# Patient Record
Sex: Female | Born: 1960
Health system: Southern US, Community
[De-identification: ages and names within clinical notes are randomized; demographics above are authoritative.]

## PROBLEM LIST (undated history)

## (undated) DIAGNOSIS — S92302A Fracture of unspecified metatarsal bone(s), left foot, initial encounter for closed fracture: Secondary | ICD-10-CM

## (undated) DIAGNOSIS — R569 Unspecified convulsions: Secondary | ICD-10-CM

## (undated) DIAGNOSIS — R51 Headache: Secondary | ICD-10-CM

## (undated) DIAGNOSIS — R937 Abnormal findings on diagnostic imaging of other parts of musculoskeletal system: Secondary | ICD-10-CM

## (undated) DIAGNOSIS — K219 Gastro-esophageal reflux disease without esophagitis: Secondary | ICD-10-CM

## (undated) DIAGNOSIS — R0789 Other chest pain: Secondary | ICD-10-CM

## (undated) DIAGNOSIS — R519 Headache, unspecified: Secondary | ICD-10-CM

## (undated) HISTORY — DX: Headache: R51

## (undated) HISTORY — PX: WRIST SURGERY: SHX841

## (undated) HISTORY — DX: Fracture of unspecified metatarsal bone(s), left foot, initial encounter for closed fracture: S92.302A

## (undated) HISTORY — DX: Headache, unspecified: R51.9

## (undated) HISTORY — PX: COLONOSCOPY: SHX174

## (undated) HISTORY — DX: Gastro-esophageal reflux disease without esophagitis: K21.9

## (undated) HISTORY — DX: Other chest pain: R07.89

## (undated) HISTORY — DX: Abnormal findings on diagnostic imaging of other parts of musculoskeletal system: R93.7

---

## 1991-07-29 HISTORY — PX: BIOPSY ENDOMETRIAL: PRO11

## 1998-01-07 ENCOUNTER — Encounter: Admission: RE | Admit: 1998-01-07 | Discharge: 1998-01-07 | Payer: Self-pay | Admitting: Family Medicine

## 1998-03-18 ENCOUNTER — Encounter: Admission: RE | Admit: 1998-03-18 | Discharge: 1998-03-18 | Payer: Self-pay | Admitting: Family Medicine

## 1998-04-28 ENCOUNTER — Encounter: Admission: RE | Admit: 1998-04-28 | Discharge: 1998-04-28 | Payer: Self-pay | Admitting: Family Medicine

## 1998-05-05 ENCOUNTER — Encounter: Admission: RE | Admit: 1998-05-05 | Discharge: 1998-05-05 | Payer: Self-pay | Admitting: Family Medicine

## 1998-05-06 ENCOUNTER — Encounter: Admission: RE | Admit: 1998-05-06 | Discharge: 1998-05-06 | Payer: Self-pay | Admitting: Family Medicine

## 1998-05-20 ENCOUNTER — Encounter: Admission: RE | Admit: 1998-05-20 | Discharge: 1998-05-20 | Payer: Self-pay | Admitting: Sports Medicine

## 1998-06-24 ENCOUNTER — Encounter: Admission: RE | Admit: 1998-06-24 | Discharge: 1998-06-24 | Payer: Self-pay | Admitting: Family Medicine

## 1998-07-29 ENCOUNTER — Encounter: Admission: RE | Admit: 1998-07-29 | Discharge: 1998-07-29 | Payer: Self-pay | Admitting: Family Medicine

## 1998-09-30 ENCOUNTER — Encounter: Admission: RE | Admit: 1998-09-30 | Discharge: 1998-09-30 | Payer: Self-pay | Admitting: Family Medicine

## 1998-11-18 ENCOUNTER — Encounter: Admission: RE | Admit: 1998-11-18 | Discharge: 1998-11-18 | Payer: Self-pay | Admitting: Family Medicine

## 1998-11-25 ENCOUNTER — Other Ambulatory Visit: Admission: RE | Admit: 1998-11-25 | Discharge: 1998-11-25 | Payer: Self-pay | Admitting: Family Medicine

## 1998-11-25 ENCOUNTER — Encounter: Admission: RE | Admit: 1998-11-25 | Discharge: 1998-11-25 | Payer: Self-pay | Admitting: Family Medicine

## 1999-02-24 ENCOUNTER — Encounter: Admission: RE | Admit: 1999-02-24 | Discharge: 1999-02-24 | Payer: Self-pay | Admitting: Family Medicine

## 1999-03-30 ENCOUNTER — Encounter: Admission: RE | Admit: 1999-03-30 | Discharge: 1999-03-30 | Payer: Self-pay | Admitting: Family Medicine

## 1999-04-07 ENCOUNTER — Encounter: Admission: RE | Admit: 1999-04-07 | Discharge: 1999-04-07 | Payer: Self-pay | Admitting: Sports Medicine

## 1999-06-23 ENCOUNTER — Encounter: Admission: RE | Admit: 1999-06-23 | Discharge: 1999-06-23 | Payer: Self-pay | Admitting: Family Medicine

## 1999-07-14 ENCOUNTER — Encounter: Admission: RE | Admit: 1999-07-14 | Discharge: 1999-07-14 | Payer: Self-pay | Admitting: Family Medicine

## 1999-08-04 ENCOUNTER — Encounter: Admission: RE | Admit: 1999-08-04 | Discharge: 1999-08-04 | Payer: Self-pay | Admitting: Sports Medicine

## 1999-08-11 ENCOUNTER — Encounter: Admission: RE | Admit: 1999-08-11 | Discharge: 1999-08-11 | Payer: Self-pay | Admitting: Family Medicine

## 1999-08-17 ENCOUNTER — Encounter: Admission: RE | Admit: 1999-08-17 | Discharge: 1999-08-17 | Payer: Self-pay | Admitting: Family Medicine

## 1999-10-13 ENCOUNTER — Ambulatory Visit (HOSPITAL_COMMUNITY): Admission: RE | Admit: 1999-10-13 | Discharge: 1999-10-13 | Payer: Self-pay | Admitting: Family Medicine

## 1999-10-13 ENCOUNTER — Encounter: Admission: RE | Admit: 1999-10-13 | Discharge: 1999-10-13 | Payer: Self-pay | Admitting: Family Medicine

## 1999-10-25 ENCOUNTER — Encounter: Payer: Self-pay | Admitting: *Deleted

## 1999-10-25 ENCOUNTER — Emergency Department (HOSPITAL_COMMUNITY): Admission: EM | Admit: 1999-10-25 | Discharge: 1999-10-25 | Payer: Self-pay | Admitting: *Deleted

## 1999-10-29 DIAGNOSIS — K219 Gastro-esophageal reflux disease without esophagitis: Secondary | ICD-10-CM

## 1999-10-29 HISTORY — DX: Gastro-esophageal reflux disease without esophagitis: K21.9

## 1999-11-10 ENCOUNTER — Encounter: Admission: RE | Admit: 1999-11-10 | Discharge: 1999-11-10 | Payer: Self-pay | Admitting: Family Medicine

## 1999-11-18 ENCOUNTER — Ambulatory Visit (HOSPITAL_COMMUNITY): Admission: RE | Admit: 1999-11-18 | Discharge: 1999-11-18 | Payer: Self-pay | Admitting: Gastroenterology

## 2000-04-26 ENCOUNTER — Encounter: Admission: RE | Admit: 2000-04-26 | Discharge: 2000-04-26 | Payer: Self-pay | Admitting: Family Medicine

## 2000-08-16 ENCOUNTER — Encounter: Admission: RE | Admit: 2000-08-16 | Discharge: 2000-08-16 | Payer: Self-pay | Admitting: Family Medicine

## 2001-04-07 ENCOUNTER — Encounter: Admission: RE | Admit: 2001-04-07 | Discharge: 2001-04-07 | Payer: Self-pay | Admitting: Family Medicine

## 2001-04-07 ENCOUNTER — Encounter: Admission: RE | Admit: 2001-04-07 | Discharge: 2001-04-07 | Payer: Self-pay | Admitting: Sports Medicine

## 2001-04-07 ENCOUNTER — Encounter: Payer: Self-pay | Admitting: Family Medicine

## 2001-04-18 ENCOUNTER — Encounter: Admission: RE | Admit: 2001-04-18 | Discharge: 2001-04-18 | Payer: Self-pay | Admitting: Family Medicine

## 2001-09-05 ENCOUNTER — Encounter: Admission: RE | Admit: 2001-09-05 | Discharge: 2001-09-05 | Payer: Self-pay | Admitting: Family Medicine

## 2001-09-11 ENCOUNTER — Encounter: Payer: Self-pay | Admitting: Family Medicine

## 2001-09-11 ENCOUNTER — Encounter: Admission: RE | Admit: 2001-09-11 | Discharge: 2001-09-11 | Payer: Self-pay | Admitting: Family Medicine

## 2001-09-18 ENCOUNTER — Encounter: Admission: RE | Admit: 2001-09-18 | Discharge: 2001-09-18 | Payer: Self-pay | Admitting: Family Medicine

## 2001-12-05 ENCOUNTER — Encounter: Admission: RE | Admit: 2001-12-05 | Discharge: 2001-12-05 | Payer: Self-pay | Admitting: Family Medicine

## 2002-04-10 ENCOUNTER — Encounter: Admission: RE | Admit: 2002-04-10 | Discharge: 2002-04-10 | Payer: Self-pay | Admitting: Family Medicine

## 2002-08-21 ENCOUNTER — Encounter: Admission: RE | Admit: 2002-08-21 | Discharge: 2002-08-21 | Payer: Self-pay | Admitting: Family Medicine

## 2002-09-13 ENCOUNTER — Encounter: Admission: RE | Admit: 2002-09-13 | Discharge: 2002-09-13 | Payer: Self-pay | Admitting: Family Medicine

## 2002-09-13 ENCOUNTER — Encounter: Payer: Self-pay | Admitting: Family Medicine

## 2002-12-04 ENCOUNTER — Encounter: Admission: RE | Admit: 2002-12-04 | Discharge: 2002-12-04 | Payer: Self-pay | Admitting: Family Medicine

## 2002-12-27 HISTORY — PX: PERINEAL HIDRADENITIS EXCISION: SUR524

## 2003-06-18 ENCOUNTER — Encounter: Payer: Self-pay | Admitting: Family Medicine

## 2003-06-18 ENCOUNTER — Encounter: Admission: RE | Admit: 2003-06-18 | Discharge: 2003-06-18 | Payer: Self-pay | Admitting: Family Medicine

## 2003-08-28 DIAGNOSIS — S92302A Fracture of unspecified metatarsal bone(s), left foot, initial encounter for closed fracture: Secondary | ICD-10-CM

## 2003-08-28 HISTORY — DX: Fracture of unspecified metatarsal bone(s), left foot, initial encounter for closed fracture: S92.302A

## 2003-09-16 ENCOUNTER — Encounter: Admission: RE | Admit: 2003-09-16 | Discharge: 2003-09-16 | Payer: Self-pay | Admitting: Family Medicine

## 2003-09-16 ENCOUNTER — Encounter: Admission: RE | Admit: 2003-09-16 | Discharge: 2003-09-16 | Payer: Self-pay | Admitting: Sports Medicine

## 2003-10-01 ENCOUNTER — Encounter: Admission: RE | Admit: 2003-10-01 | Discharge: 2003-10-01 | Payer: Self-pay | Admitting: Family Medicine

## 2003-10-07 ENCOUNTER — Encounter: Admission: RE | Admit: 2003-10-07 | Discharge: 2003-10-07 | Payer: Self-pay | Admitting: Family Medicine

## 2003-10-08 ENCOUNTER — Encounter: Admission: RE | Admit: 2003-10-08 | Discharge: 2003-10-08 | Payer: Self-pay | Admitting: Family Medicine

## 2003-10-29 ENCOUNTER — Ambulatory Visit (HOSPITAL_COMMUNITY): Admission: RE | Admit: 2003-10-29 | Discharge: 2003-10-29 | Payer: Self-pay | Admitting: Family Medicine

## 2003-10-29 ENCOUNTER — Encounter: Admission: RE | Admit: 2003-10-29 | Discharge: 2003-10-29 | Payer: Self-pay | Admitting: Family Medicine

## 2004-02-18 ENCOUNTER — Encounter: Admission: RE | Admit: 2004-02-18 | Discharge: 2004-02-18 | Payer: Self-pay | Admitting: Sports Medicine

## 2004-05-19 ENCOUNTER — Encounter: Admission: RE | Admit: 2004-05-19 | Discharge: 2004-05-19 | Payer: Self-pay | Admitting: Family Medicine

## 2004-06-27 HISTORY — PX: BIOPSY ENDOMETRIAL: PRO11

## 2004-06-30 ENCOUNTER — Ambulatory Visit: Payer: Self-pay | Admitting: Family Medicine

## 2004-07-07 ENCOUNTER — Encounter: Payer: Self-pay | Admitting: Family Medicine

## 2004-07-07 ENCOUNTER — Ambulatory Visit: Payer: Self-pay | Admitting: Family Medicine

## 2004-09-29 ENCOUNTER — Ambulatory Visit: Payer: Self-pay | Admitting: Family Medicine

## 2004-10-08 ENCOUNTER — Encounter: Admission: RE | Admit: 2004-10-08 | Discharge: 2004-10-08 | Payer: Self-pay | Admitting: Sports Medicine

## 2004-12-02 ENCOUNTER — Encounter: Admission: RE | Admit: 2004-12-02 | Discharge: 2004-12-02 | Payer: Self-pay | Admitting: Sports Medicine

## 2005-01-26 ENCOUNTER — Ambulatory Visit: Payer: Self-pay | Admitting: Family Medicine

## 2005-07-20 ENCOUNTER — Ambulatory Visit: Payer: Self-pay | Admitting: Family Medicine

## 2005-07-27 ENCOUNTER — Encounter: Admission: RE | Admit: 2005-07-27 | Discharge: 2005-07-27 | Payer: Self-pay | Admitting: Family Medicine

## 2005-10-19 ENCOUNTER — Ambulatory Visit: Payer: Self-pay | Admitting: Family Medicine

## 2005-12-14 ENCOUNTER — Ambulatory Visit: Payer: Self-pay | Admitting: Family Medicine

## 2005-12-23 ENCOUNTER — Emergency Department (HOSPITAL_COMMUNITY): Admission: EM | Admit: 2005-12-23 | Discharge: 2005-12-23 | Payer: Self-pay | Admitting: Emergency Medicine

## 2006-01-25 ENCOUNTER — Ambulatory Visit: Payer: Self-pay | Admitting: Family Medicine

## 2006-04-06 ENCOUNTER — Encounter: Admission: RE | Admit: 2006-04-06 | Discharge: 2006-04-06 | Payer: Self-pay | Admitting: Family Medicine

## 2006-07-12 ENCOUNTER — Ambulatory Visit: Payer: Self-pay | Admitting: Family Medicine

## 2006-07-28 ENCOUNTER — Encounter (INDEPENDENT_AMBULATORY_CARE_PROVIDER_SITE_OTHER): Payer: Self-pay | Admitting: *Deleted

## 2006-07-28 LAB — CONVERTED CEMR LAB

## 2006-08-09 ENCOUNTER — Emergency Department (HOSPITAL_COMMUNITY): Admission: EM | Admit: 2006-08-09 | Discharge: 2006-08-09 | Payer: Self-pay | Admitting: Family Medicine

## 2006-08-16 ENCOUNTER — Ambulatory Visit: Payer: Self-pay | Admitting: Family Medicine

## 2006-08-16 ENCOUNTER — Encounter: Payer: Self-pay | Admitting: Family Medicine

## 2006-11-24 DIAGNOSIS — E669 Obesity, unspecified: Secondary | ICD-10-CM

## 2006-11-24 DIAGNOSIS — N949 Unspecified condition associated with female genital organs and menstrual cycle: Secondary | ICD-10-CM | POA: Insufficient documentation

## 2006-11-24 DIAGNOSIS — K209 Esophagitis, unspecified without bleeding: Secondary | ICD-10-CM | POA: Insufficient documentation

## 2006-11-24 DIAGNOSIS — L719 Rosacea, unspecified: Secondary | ICD-10-CM

## 2006-11-24 DIAGNOSIS — F3181 Bipolar II disorder: Secondary | ICD-10-CM

## 2006-11-24 DIAGNOSIS — F429 Obsessive-compulsive disorder, unspecified: Secondary | ICD-10-CM

## 2006-11-24 DIAGNOSIS — K649 Unspecified hemorrhoids: Secondary | ICD-10-CM | POA: Insufficient documentation

## 2006-11-24 DIAGNOSIS — L732 Hidradenitis suppurativa: Secondary | ICD-10-CM

## 2006-11-24 DIAGNOSIS — M5382 Other specified dorsopathies, cervical region: Secondary | ICD-10-CM | POA: Insufficient documentation

## 2006-11-25 ENCOUNTER — Encounter (INDEPENDENT_AMBULATORY_CARE_PROVIDER_SITE_OTHER): Payer: Self-pay | Admitting: *Deleted

## 2006-12-14 ENCOUNTER — Telehealth (INDEPENDENT_AMBULATORY_CARE_PROVIDER_SITE_OTHER): Payer: Self-pay | Admitting: *Deleted

## 2006-12-28 ENCOUNTER — Encounter: Payer: Self-pay | Admitting: Family Medicine

## 2007-01-03 ENCOUNTER — Ambulatory Visit: Payer: Self-pay | Admitting: Family Medicine

## 2007-01-18 ENCOUNTER — Telehealth: Payer: Self-pay | Admitting: Family Medicine

## 2007-02-07 ENCOUNTER — Ambulatory Visit: Payer: Self-pay | Admitting: Family Medicine

## 2007-02-07 LAB — CONVERTED CEMR LAB
BUN: 10 mg/dL (ref 6–23)
Bilirubin Urine: NEGATIVE
Blood in Urine, dipstick: NEGATIVE
CO2: 25 meq/L (ref 19–32)
Calcium: 10 mg/dL (ref 8.4–10.5)
Chloride: 100 meq/L (ref 96–112)
Cholesterol: 213 mg/dL — ABNORMAL HIGH (ref 0–200)
Creatinine, Ser: 0.71 mg/dL (ref 0.40–1.20)
Glucose, Bld: 78 mg/dL (ref 70–99)
Glucose, Urine, Semiquant: NEGATIVE
HCT: 39.6 %
HDL: 84 mg/dL (ref >39)
Hemoglobin: 13.6 g/dL
Ketones, urine, test strip: NEGATIVE
LDL Cholesterol: 106 mg/dL — ABNORMAL HIGH (ref 0–99)
MCV: 85.3 fL
Nitrite: NEGATIVE
Platelets: 316 10*3/uL
Potassium: 4.4 meq/L (ref 3.5–5.3)
Protein, U semiquant: NEGATIVE
RBC: 4.64 M/uL
Sodium: 138 meq/L (ref 135–145)
Specific Gravity, Urine: 1.01
Total CHOL/HDL Ratio: 2.5
Triglycerides: 113 mg/dL (ref <150)
Urobilinogen, UA: 0.2
VLDL: 23 mg/dL (ref 0–40)
WBC: 6.1 10*3/uL
pH: 7

## 2007-02-09 ENCOUNTER — Encounter: Payer: Self-pay | Admitting: Family Medicine

## 2007-04-11 ENCOUNTER — Ambulatory Visit: Payer: Self-pay | Admitting: Family Medicine

## 2007-04-21 ENCOUNTER — Telehealth: Payer: Self-pay | Admitting: Family Medicine

## 2007-04-27 ENCOUNTER — Encounter: Payer: Self-pay | Admitting: Family Medicine

## 2007-05-02 ENCOUNTER — Ambulatory Visit: Payer: Self-pay | Admitting: Family Medicine

## 2007-05-03 LAB — CONVERTED CEMR LAB
HCT: 37.3 % (ref 36.0–46.0)
Hemoglobin: 12 g/dL (ref 12.0–15.0)
MCHC: 32.2 g/dL (ref 30.0–36.0)
MCV: 88.2 fL (ref 78.0–100.0)
Platelets: 285 10*3/uL (ref 150–400)
RBC: 4.23 M/uL (ref 3.87–5.11)
RDW: 13.9 % (ref 11.5–14.0)
WBC: 5.3 10*3/uL (ref 4.0–10.5)

## 2007-05-26 ENCOUNTER — Telehealth: Payer: Self-pay | Admitting: Family Medicine

## 2007-07-04 ENCOUNTER — Telehealth: Payer: Self-pay | Admitting: Family Medicine

## 2007-07-04 ENCOUNTER — Ambulatory Visit: Payer: Self-pay | Admitting: Family Medicine

## 2007-09-22 ENCOUNTER — Ambulatory Visit: Payer: Self-pay | Admitting: Family Medicine

## 2007-09-22 ENCOUNTER — Encounter: Payer: Self-pay | Admitting: Family Medicine

## 2007-09-22 ENCOUNTER — Telehealth: Payer: Self-pay | Admitting: *Deleted

## 2007-09-22 LAB — CONVERTED CEMR LAB
ALT: 19 units/L (ref 0–35)
AST: 20 units/L (ref 0–37)
Albumin: 4.4 g/dL (ref 3.5–5.2)
Alkaline Phosphatase: 34 units/L — ABNORMAL LOW (ref 39–117)
BUN: 10 mg/dL (ref 6–23)
CO2: 20 meq/L (ref 19–32)
Calcium: 8.1 mg/dL — ABNORMAL LOW (ref 8.4–10.5)
Chloride: 105 meq/L (ref 96–112)
Creatinine, Ser: 0.62 mg/dL (ref 0.40–1.20)
Glucose, Bld: 73 mg/dL (ref 70–99)
Potassium: 3.7 meq/L (ref 3.5–5.3)
Sodium: 139 meq/L (ref 135–145)
TSH: 5.781 microintl units/mL — ABNORMAL HIGH (ref 0.350–5.50)
Total Bilirubin: 0.2 mg/dL — ABNORMAL LOW (ref 0.3–1.2)
Total Protein: 6.8 g/dL (ref 6.0–8.3)

## 2007-10-10 ENCOUNTER — Ambulatory Visit: Payer: Self-pay | Admitting: Family Medicine

## 2007-10-11 ENCOUNTER — Encounter: Payer: Self-pay | Admitting: *Deleted

## 2007-10-18 ENCOUNTER — Telehealth: Payer: Self-pay | Admitting: *Deleted

## 2007-11-13 ENCOUNTER — Telehealth: Payer: Self-pay | Admitting: Family Medicine

## 2007-11-18 ENCOUNTER — Encounter
Admission: RE | Admit: 2007-11-18 | Discharge: 2007-11-18 | Payer: Self-pay | Admitting: Physical Medicine and Rehabilitation

## 2007-11-21 ENCOUNTER — Encounter: Payer: Self-pay | Admitting: Family Medicine

## 2007-11-24 ENCOUNTER — Encounter: Payer: Self-pay | Admitting: Family Medicine

## 2007-11-26 DIAGNOSIS — R937 Abnormal findings on diagnostic imaging of other parts of musculoskeletal system: Secondary | ICD-10-CM

## 2007-11-26 HISTORY — DX: Abnormal findings on diagnostic imaging of other parts of musculoskeletal system: R93.7

## 2007-12-10 ENCOUNTER — Encounter
Admission: RE | Admit: 2007-12-10 | Discharge: 2007-12-10 | Payer: Self-pay | Admitting: Physical Medicine and Rehabilitation

## 2007-12-15 ENCOUNTER — Telehealth: Payer: Self-pay | Admitting: Family Medicine

## 2007-12-25 ENCOUNTER — Encounter: Payer: Self-pay | Admitting: Family Medicine

## 2008-01-30 ENCOUNTER — Encounter: Payer: Self-pay | Admitting: *Deleted

## 2008-02-02 ENCOUNTER — Telehealth: Payer: Self-pay | Admitting: Family Medicine

## 2008-02-14 ENCOUNTER — Encounter: Payer: Self-pay | Admitting: Family Medicine

## 2008-02-14 ENCOUNTER — Telehealth (INDEPENDENT_AMBULATORY_CARE_PROVIDER_SITE_OTHER): Payer: Self-pay | Admitting: *Deleted

## 2008-02-15 ENCOUNTER — Encounter: Payer: Self-pay | Admitting: *Deleted

## 2008-02-26 ENCOUNTER — Telehealth: Payer: Self-pay | Admitting: *Deleted

## 2008-03-19 ENCOUNTER — Ambulatory Visit: Payer: Self-pay | Admitting: Family Medicine

## 2008-03-19 ENCOUNTER — Encounter: Payer: Self-pay | Admitting: Family Medicine

## 2008-03-20 ENCOUNTER — Telehealth: Payer: Self-pay | Admitting: Family Medicine

## 2008-03-21 ENCOUNTER — Telehealth (INDEPENDENT_AMBULATORY_CARE_PROVIDER_SITE_OTHER): Payer: Self-pay | Admitting: *Deleted

## 2008-03-21 ENCOUNTER — Encounter: Payer: Self-pay | Admitting: Family Medicine

## 2008-03-22 ENCOUNTER — Telehealth: Payer: Self-pay | Admitting: *Deleted

## 2008-04-03 ENCOUNTER — Telehealth: Payer: Self-pay | Admitting: *Deleted

## 2008-04-04 ENCOUNTER — Encounter: Payer: Self-pay | Admitting: Family Medicine

## 2008-04-04 ENCOUNTER — Ambulatory Visit: Payer: Self-pay | Admitting: Family Medicine

## 2008-04-09 ENCOUNTER — Encounter: Admission: RE | Admit: 2008-04-09 | Discharge: 2008-04-09 | Payer: Self-pay | Admitting: Family Medicine

## 2008-04-16 ENCOUNTER — Encounter: Payer: Self-pay | Admitting: *Deleted

## 2008-06-11 ENCOUNTER — Encounter: Payer: Self-pay | Admitting: *Deleted

## 2008-06-25 ENCOUNTER — Ambulatory Visit: Payer: Self-pay | Admitting: Family Medicine

## 2008-06-26 LAB — CONVERTED CEMR LAB
ALT: 13 units/L (ref 0–35)
AST: 15 units/L (ref 0–37)
Albumin: 4.3 g/dL (ref 3.5–5.2)
Alkaline Phosphatase: 38 units/L — ABNORMAL LOW (ref 39–117)
BUN: 9 mg/dL (ref 6–23)
CO2: 20 meq/L (ref 19–32)
Calcium: 8.7 mg/dL (ref 8.4–10.5)
Chloride: 100 meq/L (ref 96–112)
Cholesterol: 241 mg/dL — ABNORMAL HIGH (ref 0–200)
Creatinine, Ser: 0.63 mg/dL (ref 0.40–1.20)
Glucose, Bld: 81 mg/dL (ref 70–99)
HCT: 37.9 % (ref 36.0–46.0)
HDL: 58 mg/dL (ref >39)
Hemoglobin: 12.2 g/dL (ref 12.0–15.0)
LDL Cholesterol: 145 mg/dL — ABNORMAL HIGH (ref 0–99)
MCHC: 32.2 g/dL (ref 30.0–36.0)
MCV: 85.7 fL (ref 78.0–100.0)
Platelets: 299 10*3/uL (ref 150–400)
Potassium: 4 meq/L (ref 3.5–5.3)
RBC: 4.42 M/uL (ref 3.87–5.11)
RDW: 13.7 % (ref 11.5–15.5)
Sodium: 136 meq/L (ref 135–145)
TSH: 9.685 microintl units/mL — ABNORMAL HIGH (ref 0.350–4.50)
Total Bilirubin: 0.3 mg/dL (ref 0.3–1.2)
Total CHOL/HDL Ratio: 4.2
Total Protein: 6.9 g/dL (ref 6.0–8.3)
Triglycerides: 189 mg/dL — ABNORMAL HIGH (ref <150)
VLDL: 38 mg/dL (ref 0–40)
WBC: 6.8 10*3/uL (ref 4.0–10.5)

## 2008-07-09 ENCOUNTER — Telehealth: Payer: Self-pay | Admitting: Family Medicine

## 2008-07-15 ENCOUNTER — Encounter: Payer: Self-pay | Admitting: Family Medicine

## 2008-07-15 ENCOUNTER — Encounter: Admission: RE | Admit: 2008-07-15 | Discharge: 2008-07-15 | Payer: Self-pay | Admitting: Family Medicine

## 2008-07-29 ENCOUNTER — Telehealth: Payer: Self-pay | Admitting: *Deleted

## 2008-07-31 ENCOUNTER — Encounter: Payer: Self-pay | Admitting: Family Medicine

## 2008-09-10 ENCOUNTER — Telehealth: Payer: Self-pay | Admitting: *Deleted

## 2008-09-10 ENCOUNTER — Ambulatory Visit: Payer: Self-pay | Admitting: Family Medicine

## 2008-09-10 DIAGNOSIS — E039 Hypothyroidism, unspecified: Secondary | ICD-10-CM | POA: Insufficient documentation

## 2008-09-10 LAB — CONVERTED CEMR LAB
Bilirubin Urine: NEGATIVE
Blood in Urine, dipstick: NEGATIVE
Free T4: 0.83 ng/dL — ABNORMAL LOW (ref 0.89–1.80)
Glucose, Urine, Semiquant: NEGATIVE
Ketones, urine, test strip: NEGATIVE
Nitrite: NEGATIVE
Protein, U semiquant: NEGATIVE
Specific Gravity, Urine: <1.005
TSH: 6.581 microintl units/mL — ABNORMAL HIGH (ref 0.350–4.50)
Urobilinogen, UA: 0.2
pH: 7

## 2008-09-13 ENCOUNTER — Telehealth: Payer: Self-pay | Admitting: Family Medicine

## 2008-10-17 ENCOUNTER — Telehealth: Payer: Self-pay | Admitting: Family Medicine

## 2008-10-22 ENCOUNTER — Telehealth: Payer: Self-pay | Admitting: *Deleted

## 2008-10-22 ENCOUNTER — Ambulatory Visit: Payer: Self-pay | Admitting: Family Medicine

## 2008-10-22 DIAGNOSIS — K5900 Constipation, unspecified: Secondary | ICD-10-CM | POA: Insufficient documentation

## 2008-10-22 LAB — CONVERTED CEMR LAB
Free T4: 0.86 ng/dL — ABNORMAL LOW (ref 0.89–1.80)
TSH: 8.332 microintl units/mL — ABNORMAL HIGH (ref 0.350–4.50)
Vit D, 1,25-Dihydroxy: 13 — ABNORMAL LOW (ref 30–89)

## 2008-10-23 ENCOUNTER — Telehealth: Payer: Self-pay | Admitting: Family Medicine

## 2008-10-23 ENCOUNTER — Encounter: Payer: Self-pay | Admitting: Family Medicine

## 2008-11-04 ENCOUNTER — Telehealth: Payer: Self-pay | Admitting: Family Medicine

## 2008-11-19 ENCOUNTER — Ambulatory Visit: Payer: Self-pay | Admitting: Family Medicine

## 2008-11-19 DIAGNOSIS — L259 Unspecified contact dermatitis, unspecified cause: Secondary | ICD-10-CM | POA: Insufficient documentation

## 2008-11-20 LAB — CONVERTED CEMR LAB
Free T4: 1.27 ng/dL (ref 0.89–1.80)
TSH: 2.41 microintl units/mL (ref 0.350–4.50)

## 2009-01-07 ENCOUNTER — Ambulatory Visit: Payer: Self-pay | Admitting: Family Medicine

## 2009-01-07 LAB — CONVERTED CEMR LAB
Free T4: 0.71 ng/dL — ABNORMAL LOW (ref 0.80–1.80)
TSH: 2.55 microintl units/mL (ref 0.350–4.500)

## 2009-01-15 ENCOUNTER — Telehealth: Payer: Self-pay | Admitting: Family Medicine

## 2009-01-20 ENCOUNTER — Telehealth: Payer: Self-pay | Admitting: Family Medicine

## 2009-02-04 ENCOUNTER — Ambulatory Visit: Payer: Self-pay | Admitting: Family Medicine

## 2009-02-04 LAB — CONVERTED CEMR LAB
HCT: 35.5 % — ABNORMAL LOW (ref 36.0–46.0)
Hemoglobin: 11.5 g/dL — ABNORMAL LOW (ref 12.0–15.0)
MCHC: 32.4 g/dL (ref 30.0–36.0)
MCV: 84.9 fL (ref 78.0–100.0)
Platelets: 289 10*3/uL (ref 150–400)
RBC: 4.18 M/uL (ref 3.87–5.11)
RDW: 14 % (ref 11.5–15.5)
Total CK: 121 units/L (ref 7–177)
WBC: 6.9 10*3/uL (ref 4.0–10.5)

## 2009-02-12 ENCOUNTER — Encounter: Payer: Self-pay | Admitting: Family Medicine

## 2009-03-12 ENCOUNTER — Encounter: Payer: Self-pay | Admitting: Family Medicine

## 2009-03-13 ENCOUNTER — Telehealth: Payer: Self-pay | Admitting: Family Medicine

## 2009-04-07 ENCOUNTER — Encounter: Payer: Self-pay | Admitting: Family Medicine

## 2009-04-14 ENCOUNTER — Telehealth: Payer: Self-pay | Admitting: *Deleted

## 2009-04-15 ENCOUNTER — Ambulatory Visit (HOSPITAL_COMMUNITY): Admission: RE | Admit: 2009-04-15 | Discharge: 2009-04-15 | Payer: Self-pay | Admitting: Gastroenterology

## 2009-05-05 ENCOUNTER — Encounter: Payer: Self-pay | Admitting: Family Medicine

## 2009-05-13 ENCOUNTER — Ambulatory Visit: Payer: Self-pay | Admitting: Family Medicine

## 2009-05-13 LAB — CONVERTED CEMR LAB
AST: 15 units/L (ref 0–37)
Alkaline Phosphatase: 32 units/L — ABNORMAL LOW (ref 39–117)
BUN: 9 mg/dL (ref 6–23)
Calcium: 8.9 mg/dL (ref 8.4–10.5)
Creatinine, Ser: 0.6 mg/dL (ref 0.40–1.20)
MCV: 86.2 fL (ref 78.0–100.0)
Platelets: 266 10*3/uL (ref 150–400)
Potassium: 4.1 meq/L (ref 3.5–5.3)
RBC: 4.19 M/uL (ref 3.87–5.11)
Sodium: 136 meq/L (ref 135–145)
TSH: 10.359 microintl units/mL — ABNORMAL HIGH (ref 0.350–4.500)
Vit D, 25-Hydroxy: 17 ng/mL — ABNORMAL LOW (ref 30–89)
WBC: 5.9 10*3/uL (ref 4.0–10.5)

## 2009-05-16 ENCOUNTER — Encounter: Payer: Self-pay | Admitting: Family Medicine

## 2009-06-18 ENCOUNTER — Encounter: Payer: Self-pay | Admitting: Family Medicine

## 2009-06-19 ENCOUNTER — Encounter: Payer: Self-pay | Admitting: Family Medicine

## 2009-08-04 ENCOUNTER — Encounter: Admission: RE | Admit: 2009-08-04 | Discharge: 2009-08-04 | Payer: Self-pay | Admitting: Rheumatology

## 2009-09-05 ENCOUNTER — Encounter: Payer: Self-pay | Admitting: Family Medicine

## 2009-09-10 ENCOUNTER — Encounter: Payer: Self-pay | Admitting: Family Medicine

## 2009-09-30 ENCOUNTER — Ambulatory Visit: Payer: Self-pay | Admitting: Family Medicine

## 2009-09-30 DIAGNOSIS — K602 Anal fissure, unspecified: Secondary | ICD-10-CM

## 2009-09-30 DIAGNOSIS — M797 Fibromyalgia: Secondary | ICD-10-CM

## 2009-10-03 ENCOUNTER — Encounter: Payer: Self-pay | Admitting: Family Medicine

## 2009-10-03 LAB — CONVERTED CEMR LAB
HCT: 36.9 % (ref 36.0–46.0)
Hemoglobin: 12 g/dL (ref 12.0–15.0)
Platelets: 284 10*3/uL (ref 150–400)
Vit D, 25-Hydroxy: 35 ng/mL (ref 30–89)
WBC: 6.3 10*3/uL (ref 4.0–10.5)

## 2009-11-05 ENCOUNTER — Encounter: Admission: RE | Admit: 2009-11-05 | Discharge: 2009-11-05 | Payer: Self-pay | Admitting: Family Medicine

## 2009-11-13 ENCOUNTER — Encounter: Payer: Self-pay | Admitting: Family Medicine

## 2009-11-18 ENCOUNTER — Encounter: Payer: Self-pay | Admitting: *Deleted

## 2009-12-09 ENCOUNTER — Encounter: Payer: Self-pay | Admitting: Family Medicine

## 2009-12-11 ENCOUNTER — Telehealth: Payer: Self-pay | Admitting: Family Medicine

## 2009-12-25 ENCOUNTER — Telehealth: Payer: Self-pay | Admitting: *Deleted

## 2010-01-06 ENCOUNTER — Ambulatory Visit: Payer: Self-pay | Admitting: Family Medicine

## 2010-01-06 DIAGNOSIS — K921 Melena: Secondary | ICD-10-CM

## 2010-01-06 LAB — CONVERTED CEMR LAB
ALT: 19 units/L (ref 0–35)
AST: 14 units/L (ref 0–37)
Alkaline Phosphatase: 45 units/L (ref 39–117)
CO2: 24 meq/L (ref 19–32)
Creatinine, Ser: 0.65 mg/dL (ref 0.40–1.20)
Iron: 41 ug/dL — ABNORMAL LOW (ref 42–145)
MCV: 85.8 fL (ref 78.0–100.0)
Platelets: 241 10*3/uL (ref 150–400)
RDW: 14.3 % (ref 11.5–15.5)
Sodium: 139 meq/L (ref 135–145)
Total Bilirubin: 0.3 mg/dL (ref 0.3–1.2)
Total Protein: 6.6 g/dL (ref 6.0–8.3)
UIBC: 322 ug/dL
WBC: 5.4 10*3/uL (ref 4.0–10.5)

## 2010-01-07 ENCOUNTER — Encounter: Payer: Self-pay | Admitting: Family Medicine

## 2010-01-13 ENCOUNTER — Ambulatory Visit: Payer: Self-pay | Admitting: Family Medicine

## 2010-01-14 ENCOUNTER — Encounter: Payer: Self-pay | Admitting: Family Medicine

## 2010-01-14 ENCOUNTER — Encounter (INDEPENDENT_AMBULATORY_CARE_PROVIDER_SITE_OTHER): Payer: Self-pay | Admitting: *Deleted

## 2010-01-26 ENCOUNTER — Telehealth: Payer: Self-pay | Admitting: *Deleted

## 2010-02-24 ENCOUNTER — Ambulatory Visit: Payer: Self-pay | Admitting: Family Medicine

## 2010-02-24 DIAGNOSIS — D509 Iron deficiency anemia, unspecified: Secondary | ICD-10-CM

## 2010-02-24 LAB — CONVERTED CEMR LAB
HCT: 37.4 % (ref 36.0–46.0)
Hemoglobin: 12.1 g/dL (ref 12.0–15.0)
MCHC: 32.4 g/dL (ref 30.0–36.0)
MCV: 85.4 fL (ref 78.0–100.0)
RBC: 4.38 M/uL (ref 3.87–5.11)
RDW: 13.9 % (ref 11.5–15.5)

## 2010-02-25 ENCOUNTER — Encounter: Payer: Self-pay | Admitting: Family Medicine

## 2010-04-03 ENCOUNTER — Telehealth: Payer: Self-pay | Admitting: Family Medicine

## 2010-04-14 ENCOUNTER — Ambulatory Visit: Payer: Self-pay | Admitting: Family Medicine

## 2010-04-14 ENCOUNTER — Encounter: Admission: RE | Admit: 2010-04-14 | Discharge: 2010-04-14 | Payer: Self-pay | Admitting: Family Medicine

## 2010-04-14 DIAGNOSIS — R159 Full incontinence of feces: Secondary | ICD-10-CM | POA: Insufficient documentation

## 2010-04-15 ENCOUNTER — Encounter: Payer: Self-pay | Admitting: Family Medicine

## 2010-04-15 LAB — CONVERTED CEMR LAB
Hemoglobin: 12 g/dL (ref 12.0–15.0)
MCHC: 33.1 g/dL (ref 30.0–36.0)
MCV: 82.5 fL (ref 78.0–100.0)
RBC: 4.39 M/uL (ref 3.87–5.11)
Sed Rate: 9 mm/hr (ref 0–22)
WBC: 6 10*3/uL (ref 4.0–10.5)

## 2010-04-16 ENCOUNTER — Telehealth: Payer: Self-pay | Admitting: Family Medicine

## 2010-04-16 ENCOUNTER — Telehealth: Payer: Self-pay | Admitting: *Deleted

## 2010-04-23 ENCOUNTER — Telehealth (INDEPENDENT_AMBULATORY_CARE_PROVIDER_SITE_OTHER): Payer: Self-pay | Admitting: *Deleted

## 2010-05-21 ENCOUNTER — Encounter: Payer: Self-pay | Admitting: Family Medicine

## 2010-05-26 ENCOUNTER — Telehealth (INDEPENDENT_AMBULATORY_CARE_PROVIDER_SITE_OTHER): Payer: Self-pay | Admitting: *Deleted

## 2010-05-26 ENCOUNTER — Ambulatory Visit: Payer: Self-pay | Admitting: Family Medicine

## 2010-05-26 DIAGNOSIS — B079 Viral wart, unspecified: Secondary | ICD-10-CM | POA: Insufficient documentation

## 2010-05-28 ENCOUNTER — Encounter (INDEPENDENT_AMBULATORY_CARE_PROVIDER_SITE_OTHER): Payer: Self-pay | Admitting: *Deleted

## 2010-06-02 ENCOUNTER — Ambulatory Visit: Payer: Self-pay | Admitting: Family Medicine

## 2010-06-25 ENCOUNTER — Telehealth: Payer: Self-pay | Admitting: Family Medicine

## 2010-07-01 ENCOUNTER — Telehealth: Payer: Self-pay | Admitting: Family Medicine

## 2010-07-02 ENCOUNTER — Ambulatory Visit: Payer: Self-pay | Admitting: Family Medicine

## 2010-08-12 ENCOUNTER — Ambulatory Visit: Payer: Self-pay | Admitting: Family Medicine

## 2010-08-15 ENCOUNTER — Encounter: Payer: Self-pay | Admitting: Family Medicine

## 2010-08-19 ENCOUNTER — Telehealth: Payer: Self-pay | Admitting: *Deleted

## 2010-08-26 ENCOUNTER — Encounter: Payer: Self-pay | Admitting: Family Medicine

## 2010-09-04 ENCOUNTER — Encounter: Payer: Self-pay | Admitting: Family Medicine

## 2010-09-04 LAB — CONVERTED CEMR LAB
Cholesterol: 238 mg/dL — ABNORMAL HIGH (ref 0–200)
Ferritin: 300 ng/mL — ABNORMAL HIGH (ref 10–291)
Hemoglobin: 12.1 g/dL (ref 12.0–15.0)
LDL Cholesterol: 133 mg/dL — ABNORMAL HIGH (ref 0–99)
MCHC: 32.4 g/dL (ref 30.0–36.0)
MCV: 86.4 fL (ref 78.0–100.0)
RBC: 4.33 M/uL (ref 3.87–5.11)
Total CHOL/HDL Ratio: 3.8
Triglycerides: 216 mg/dL — ABNORMAL HIGH (ref <150)
VLDL: 43 mg/dL — ABNORMAL HIGH (ref 0–40)
WBC: 6.4 10*3/uL (ref 4.0–10.5)

## 2010-09-08 ENCOUNTER — Ambulatory Visit: Payer: Self-pay | Admitting: Family Medicine

## 2010-09-11 ENCOUNTER — Encounter: Payer: Self-pay | Admitting: Family Medicine

## 2010-09-15 ENCOUNTER — Encounter: Payer: Self-pay | Admitting: Family Medicine

## 2010-09-15 ENCOUNTER — Ambulatory Visit: Payer: Self-pay | Admitting: Family Medicine

## 2010-09-15 DIAGNOSIS — R269 Unspecified abnormalities of gait and mobility: Secondary | ICD-10-CM

## 2010-09-15 DIAGNOSIS — E559 Vitamin D deficiency, unspecified: Secondary | ICD-10-CM | POA: Insufficient documentation

## 2010-09-15 DIAGNOSIS — N951 Menopausal and female climacteric states: Secondary | ICD-10-CM

## 2010-09-15 LAB — CONVERTED CEMR LAB
Alkaline Phosphatase: 52 units/L (ref 39–117)
CO2: 25 meq/L (ref 19–32)
Creatinine, Ser: 0.63 mg/dL (ref 0.40–1.20)
FSH: 102.6 milliintl units/mL
Glucose, Bld: 82 mg/dL (ref 70–99)
Sed Rate: 7 mm/hr (ref 0–22)
Sodium: 139 meq/L (ref 135–145)
Total Bilirubin: 0.3 mg/dL (ref 0.3–1.2)
Total CK: 46 units/L (ref 7–177)
Total Protein: 6.9 g/dL (ref 6.0–8.3)
Vit D, 25-Hydroxy: 32 ng/mL (ref 30–89)

## 2010-09-16 ENCOUNTER — Encounter: Payer: Self-pay | Admitting: Family Medicine

## 2010-09-24 ENCOUNTER — Telehealth: Payer: Self-pay | Admitting: *Deleted

## 2010-10-18 ENCOUNTER — Encounter: Payer: Self-pay | Admitting: Family Medicine

## 2010-10-20 ENCOUNTER — Ambulatory Visit: Admit: 2010-10-20 | Payer: Self-pay

## 2010-10-27 NOTE — Assessment & Plan Note (Signed)
Summary: F/U/KH   Vital Signs:  Patient profile:   50 year old female Menstrual status:  irregular Height:      61.5 inches Weight:      173.7 pounds BMI:     32.41 Pulse rate:   94 / minute BP sitting:   143 / 92  (left arm) Cuff size:   regular  Vitals Entered By: Arlyss Repress CMA, (Feb 24, 2010 3:39 PM) CC: f/up pap. leg pain Is Patient Diabetic? No Pain Assessment Patient in pain? yes     Location: legs Intensity: 8 Onset of pain  Chronic   Primary Care Provider:  Zachery Dauer MD  CC:  f/up pap. leg pain.  History of Present Illness: Had and episode of sharp right lateral leg pain one week ago.  Aching bilateral legs since. Gabapentin helps, but has increased the dose to 1200 mg qid.Not sleepy.    She is off the promethazine and often doesn't take the morning Anafranil. OCD no worse.    Will go to counselor at Dr [facility] office June 15th. Didn't see any benefit from Cymbalta 30 or 60 mg.   No more rectal bleeding. Will be due for follow-up colonoscopy this year by Dr Elnoria Howard.   Recently stopped the iron. Not due to side effects.   Habits & Providers  Alcohol-Tobacco-Diet     Tobacco Status: quit > 6 months     Tobacco Counseling: to remain off tobacco products  Current Medications (verified): 1)  Promethazine Hcl 25 Mg Tabs (Promethazine Hcl) .... Take 1 Tab Q 6hours Prn 2)  Xanax 2 Mg Tabs (Alprazolam) .... Take 1 Tablet By Mouth Three Times A Day 3)  Anafranil 50 Mg  Caps (Clomipramine Hcl) .... Take Two Tabs in Am and 3 Tabs in P.m. 4)  Levothroid 125 Mcg Tabs (Levothyroxine Sodium) .... Take One Tablet Daily 5)  Triamcinolone Acetonide 0.1 % Crea (Triamcinolone Acetonide) .... Apply To Hands Two Times A Day As Needed 6)  Vitamin D3 1000 Unit Caps (Cholecalciferol) .... Take One Tablet Daily 7)  Omeprazole 20 Mg Cpdr (Omeprazole) .... Take One Tab  A Day As Needed 8)  Gabapentin 600 Mg Tabs (Gabapentin) .... Take 2  Tabs 4 Times Daily 9)  Aleve 220 Mg Tabs  (Naproxen Sodium) .... Take 1-2 Tabs Two Times A Day As Needed Pain 10)  Ferrous Sulfate 324 Mg Tbec (Ferrous Sulfate) .... Take One Tablet Daily  Allergies (verified): 1)  Paxil (Paroxetine Hcl)  Social History: Smoking Status:  quit > 6 months  Physical Exam  General:  Abdominal obesity, alert.   Lungs:  Normal respiratory effort, chest expands symmetrically. Lungs are clear to auscultation, no crackles or wheezes. Heart:  Normal rate and regular rhythm. S1 and S2 normal without gallop, murmur, click, rub or other extra sounds. Extremities:  Normal range of motion, without edema   Impression & Recommendations:  Problem # 1:  ANEMIA, IRON DEFICIENCY (ICD-280.9) Recommended taking iron for 6 months. Her updated medication list for this problem includes:    Ferrous Sulfate 324 Mg Tbec (Ferrous sulfate) .Marland Kitchen... Take one tablet daily  Orders: CBC-[facility] ([address]) [facility]- Est  Level 4 ([address]214)  Problem # 2:  BLOOD IN STOOL (ICD-578.1) Assessment: Improved  Orders: [facility]- Est  Level 4 ([address]454)  Problem # 3:  FIBROMYALGIA (ICD-729.1) Continue same dose of Gabapentin Her updated medication list for this problem includes:    Aleve 220 Mg Tabs (Naproxen sodium) .Marland Kitchen... Take 1-2 tabs two times a day  as needed pain  Complete Medication List: 1)  Promethazine Hcl 25 Mg Tabs (Promethazine hcl) .... Take 1 tab q 6hours prn 2)  Xanax 2 Mg Tabs (Alprazolam) .... Take 1 tablet by mouth three times a day 3)  Anafranil 50 Mg Caps (Clomipramine hcl) .... Take two tabs in am and 3 tabs in p.m. 4)  Levothroid 125 Mcg Tabs (Levothyroxine sodium) .... Take one tablet daily 5)  Triamcinolone Acetonide 0.1 % Crea (Triamcinolone acetonide) .... Apply to hands two times a day as needed 6)  Vitamin D3 1000 Unit Caps (Cholecalciferol) .... Take one tablet daily 7)  Omeprazole 20 Mg Cpdr (Omeprazole) .... Take one tab  a day as needed 8)  Gabapentin 600 Mg Tabs (Gabapentin) .... Take 2  tabs 4 times daily 9)  Aleve  220 Mg Tabs (Naproxen sodium) .... Take 1-2 tabs two times a day as needed pain 10)  Ferrous Sulfate 324 Mg Tbec (Ferrous sulfate) .... Take one tablet daily  Patient Instructions: 1)  Please schedule a follow-up appointment in 3 months .  2)  Continue medications same Prescriptions: GABAPENTIN 600 MG TABS (GABAPENTIN) Take 2  tabs 4 times daily  #240 x 6   Entered and Authorized by:   Zachery Dauer MD   Signed by:   Zachery Dauer MD on 02/24/2010   Method used:   Historical   RxID:   [id number]     Prevention & Chronic Care Immunizations   Influenza vaccine: Fluvax MCR  (09/30/2009)   Influenza vaccine due: 06/25/2009    Tetanus booster: 11/26/2002: Done.   Tetanus booster due: 11/25/2012    Pneumococcal vaccine: Not documented  Other Screening   Pap smear: NEGATIVE FOR INTRAEPITHELIAL LESIONS OR MALIGNANCY.  (01/13/2010)   Pap smear due: 03/19/2009    Mammogram: ASSESSMENT: Negative - BI-RADS 1^MM DIGITAL SCREENING  (11/05/2009)   Mammogram due: 11/05/2010  Reports requested:   Last colonoscopy report requested.  Smoking status: quit > 6 months  (02/24/2010)  Lipids   Total Cholesterol: 241  (06/25/2008)   LDL: 145  (06/25/2008)   LDL Direct: Not documented   HDL: 58  (06/25/2008)   Triglycerides: 189  (06/25/2008)   Nursing Instructions: Request report of last colonoscopy

## 2010-10-27 NOTE — Letter (Signed)
Summary: Generic Letter  [facility]  [address]   [place], [place] [address]   Phone: [phone]  Fax: [phone]    05/28/2010  Jenna Burke [address] [place], [place]  [address]  Dear Ms. Teasdale,  You have an apptiotment with Dr. Kellie Simmering for Sept. 21 at 9:30 AM.           Sincerely,   Starleen Blue RN

## 2010-10-27 NOTE — Assessment & Plan Note (Signed)
Summary: fibromyalgia,tcb   Vital Signs:  Patient profile:   50 year old female Menstrual status:  irregular Height:      61.5 inches Weight:      175 pounds BMI:     32.65 Temp:     98.1 degrees F oral Pulse rate:   87 / minute BP sitting:   116 / 78  (left arm) Cuff size:   large  Vitals Entered By: Tessie Fass CMA (January 06, 2010 10:00 AM) CC: fibromyalgia pain Is Patient Diabetic? No Pain Assessment Patient in pain? yes     Location: fibromyalgia pain Intensity: 10   Primary Care Provider:  Zachery Dauer MD  CC:  fibromyalgia pain.  History of Present Illness: Has seen Dr Waverly Ferrari a couple times and also a Veterinary surgeon.   Complains of low back and bilateral upper leg pain without numbness. Legs generally weak.   She was started on Cymbalta 30 mg and then 60 mg daily. Didn't help. No side effects. Suggested your regular doc rx Lyrica which they don't prescribe. Still washes her hands and feet frequently.   Slept poorly last night, possibly in anticipation of today's visit. Doesn't like to take showers.   Has nocturia x 2-3. Has been eating a lot of chocolate   Asked for iron studies and reported bright red blood in stools at end of interview. Scheduled for pap next week. Hasn't had menses for months.   Habits & Providers  Alcohol-Tobacco-Diet     Tobacco Status: quit  Current Medications (verified): 1)  Promethazine Hcl 25 Mg Tabs (Promethazine Hcl) .... Take 1 Tab Q 6hours Prn 2)  Xanax 2 Mg Tabs (Alprazolam) .... Take 1 Tablet By Mouth Three Times A Day 3)  Anafranil 50 Mg  Caps (Clomipramine Hcl) .... Take Two Tabs in Am and 3 Tabs in P.m. 4)  Levothroid 125 Mcg Tabs (Levothyroxine Sodium) .... Take One Tablet Daily 5)  Triamcinolone Acetonide 0.1 % Crea (Triamcinolone Acetonide) .... Apply To Hands Two Times A Day As Needed 6)  Vitamin D3 1000 Unit Caps (Cholecalciferol) .... Take One Tablet Daily 7)  Omeprazole 20 Mg Cpdr (Omeprazole) .... Take One Tab  A Day As  Needed  Allergies (verified): 1)  Paxil (Paroxetine Hcl)  Physical Exam  General:  Abdominal obesity Lungs:  Normal respiratory effort, chest expands symmetrically. Lungs are clear to auscultation, no crackles or wheezes. Heart:  Normal rate and regular rhythm. S1 and S2 normal without gallop, murmur, click, rub or other extra sounds. Abdomen:  soft and non-tender.  Marked obesity. Msk:  Pain SI area with forward and backward movement and limited motion, but no palpable spasm.  Extremities:  Normal range of motion, but pain with rotation of hips.  Neurologic:  Normal heel and toe walking.  DTR's 3+ knees and ankle with one beat of ankle clonus. SLR normal bilaterally. Skin:  Mild reddening of  hands    Inguinal Nodes:  No significant adenopathy Psych:  normally interactive, good eye contact, and moderately  anxious.     Impression & Recommendations:  Problem # 1:  OSTEOARTHROSIS UNSPEC WHETHER GEN/LOC LOWER LEG (ICD-715.96) Will see response to gabapentin. No significant disk problem on MRI of LS spine in 2009. ESR to rule out inflammatory component The following medications were removed from the medication list:    Aleve 220 Mg Tabs (Naproxen sodium) .Marland Kitchen... Take one tab three times a day  Orders: [facility]- Est  Level 4 ([address])  Problem # 2:  FIBROMYALGIA (  ICD-729.1) Will see response to gabapentin The following medications were removed from the medication list:    Aleve 220 Mg Tabs (Naproxen sodium) .Marland Kitchen... Take one tab three times a day  Orders: Sed Rate (ESR)-[facility] [id number]) Comp Met-[facility] [phone]) [facility]- Est  Level 4 (86578)  Problem # 3:  BLOOD IN STOOL (ICD-578.1) Will evaluate on pelvic exam next week Orders: Ferritin-[facility] ([id number]) Iron Binding Cap (TIBC)-[facility] ([address]) Iron -[facility] ([id number]) [facility]- Est  Level 4 (47425)  Problem # 4:  OBSESSIVE COMPUL. DISORDER (ICD-300.3) Same  Problem # 5:  OBESITY, NOS (ICD-278.00)  Action plan made for weight loss. Walking  and avoiding sweets.   Orders: [facility]- Est  Level 4 (95638)  Complete Medication List: 1)  Promethazine Hcl 25 Mg Tabs (Promethazine hcl) .... Take 1 tab q 6hours prn 2)  Xanax 2 Mg Tabs (Alprazolam) .... Take 1 tablet by mouth three times a day 3)  Anafranil 50 Mg Caps (Clomipramine hcl) .... Take two tabs in am and 3 tabs in p.m. 4)  Levothroid 125 Mcg Tabs (Levothyroxine sodium) .... Take one tablet daily 5)  Triamcinolone Acetonide 0.1 % Crea (Triamcinolone acetonide) .... Apply to hands two times a day as needed 6)  Vitamin D3 1000 Unit Caps (Cholecalciferol) .... Take one tablet daily 7)  Omeprazole 20 Mg Cpdr (Omeprazole) .... Take one tab  a day as needed 8)  Neurontin 300 Mg Caps (Gabapentin) .... Take one tab two times a day  Other Orders: CBC-[facility] ([address])  Patient Instructions: 1)  You will walk 10 minutes 5 days a week.  2)  Niew medicine, gabapentin. Call if side effects.  Prescriptions: NEURONTIN 300 MG CAPS (GABAPENTIN) Take one tab two times a day  #60 x 1   Entered by:   Luretha Murphy NP   Authorized by:   Zachery Dauer MD   Signed by:   Luretha Murphy NP on 01/06/2010   Method used:   [facility] to        [facility]  [address] [id number]       [address]       [place], [place]  [address]       Ph: [phone]       Fax: [phone]   RxID:   [id number]   Laboratory Results   Blood Tests   Date/Time Received: January 06, 2010 10:45 AM  Date/Time Reported: January 06, 2010 12:11 PM   SED rate: 10 mm/hr  Comments: ...............test performed by......Marland KitchenBonnie A. [place], MLS (ASCP)cm

## 2010-10-27 NOTE — Progress Notes (Signed)
Summary: meds prob  Phone Note Call from Patient Call back at Home Phone [phone]   Caller: Patient Summary of Call: thinks that the Gabapentin is making her back/side hurt and wants to know if the generic does this or does he think she should take to name brand? Initial call taken by: De Nurse,  April 03, 2010 8:46 AM  Follow-up for Phone Call        some pople told her that this med ruins kidneys. she stopped taking it last night. states it had been helping her legs. wonders if name brand would be better for her. told her if she has low back pain, should come in to be seen. she has appt with pcp on the 19th. refused to see anyone else,sooner. wants to know what the md thinks about this. told her i will call her back with his response Follow-up by: Golden Circle RN,  April 03, 2010 8:49 AM  Additional Follow-up for Phone Call Additional follow up Details #1::        This medicine causes sleepiness, but not pain. Rather,  it is given to treat pain The brand name is no better than the generic. I take the generic on occasion.  It has no bad effects on kidneys. Additional Follow-up by: Zachery Dauer MD,  April 03, 2010 9:21 AM     Appended Document: meds prob relayed what md wrote. she states she will keep taking the med. does not want to come in earlier about her pain. will see pcp next week. told her to call back if sh changed her mind

## 2010-10-27 NOTE — Letter (Signed)
Summary: Results Follow-up Letter  [facility]  [address]   [place], [place] [address]   Phone: [phone]  Fax: [phone]    02/25/2010  [address] [name], [place]  [address]  Dear Ms. Sarli,   The following are the results of your recent test(s): Patient: Jenna Burke Your blood count is back to normal, but I recommend continuing ferrous sulfate (iron) for a total of 6 months. Tests: (1) CBC NO Diff (Complete Blood Count) (10000)   WBC                       5.1 K/uL                    4.0-10.5   RBC                       4.38 MIL/uL                 3.87-5.11   Hemoglobin                12.1 g/dL                   27.2-53.6   Hematocrit                37.4 %                      36.0-46.0   MCV                       85.4 fL                     78.0-100.0 ! MCH                       27.6 pg                     26.0-34.0   MCHC                      32.4 g/dL                   64.4-03.4   RDW                       13.9 %                      11.5-15.5   Platelet Count            262 K/uL                    150-400   Document Creation Date: 02/25/2010 2:47 AM Sincerely,  Zachery Dauer MD [facility]            Appended Document: Results Follow-up Letter mailed.

## 2010-10-27 NOTE — Letter (Signed)
Summary: Results Follow-up Letter  [facility]  [address]   [place], [place] [address]   Phone: [phone]  Fax: [phone]    01/07/2010  [address] [place], [place]  [address]  Dear Ms. Ramer,   The following are the results of your recent test(s): Patient: Jenna Burke  Tests: (1) Iron and IBC (2390)   Iron                 [L]  41 ug/dL                    57-846   UIBC                      322 ug/dL   TIBC                      363 ug/dL                   962-952   %SAT                 [L]  11 %                        20-55 This test indicates that you are low on iron Tests: (2) CBC NO Diff (Complete Blood Count) (10000)   WBC                       5.4 K/uL                    4.0-10.5   RBC                       4.22 MIL/uL                 3.87-5.11   Hemoglobin           [L]  11.7 g/dL                   84.1-32.4   Hematocrit                36.2 %                      36.0-46.0   MCV                       85.8 fL                     78.0-100.0 ! MCH                       27.7 pg                     26.0-34.0   MCHC                      32.3 g/dL                   40.1-02.7   RDW                       14.3 %  11.5-15.5   Platelet Count            241 K/uL                    150-400 You are very mildly anemic Tests: (3) Comprehensive Metabolic Panel ([address])   Sodium                    139 mEq/L                   135-145   Potassium                 4.0 mEq/L                   3.5-5.3   Chloride                  102 mEq/L                   96-112   CO2                       24 mEq/L                    19-32   Glucose                   81 mg/dL                    60-45   BUN                       15 mg/dL                    4-09   Creatinine                0.65 mg/dL                  0.40-1.20   Bilirubin, Total          0.3 mg/dL                   8.1-1.9   Alkaline Phosphatase      45 U/L                       39-117   AST/SGOT                  14 U/L                      0-37   ALT/SGPT                  19 U/L                      0-35   Total Protein             6.6 g/dL                    1.4-7.8   Albumin                   4.4 g/dL                    2.9-5.6   Calcium  9.1 mg/dL                   6.3-87.5 Your liver and kidney function is normal. Tests: (4) Ferritin ([address])   Ferritin                  139 ng/mL                   10-291 This test indicates that your iron is normal Document Creation Date: 01/06/2010 9:16 PM _______________________________________________________________________ I recommend that you take one tablet of Ferrous Sulfate daily and we recheck your blood count in 3 - 6 months.  Sincerely,  Zachery Dauer MD [facility]           Appended Document: Results Follow-up Letter mailed

## 2010-10-27 NOTE — Assessment & Plan Note (Signed)
Summary: f/u back pain and Pap,df   Vital Signs:  Patient profile:   50 year old female Menstrual status:  irregular Height:      61.5 inches Weight:      173 pounds BMI:     32.27 Temp:     98.2 degrees F oral Pulse rate:   91 / minute BP sitting:   132 / 85  (left arm) Cuff size:   regular  Vitals Entered By: Tessie Fass CMA (January 13, 2010 2:53 PM) CC: F/U pap Is Patient Diabetic? No Pain Assessment Patient in pain? yes     Location: lower back, bilateral leg Intensity: 8   Primary Care Provider:  Zachery Dauer MD  CC:  F/U pap.  History of Present Illness: Hasn't seen any blood in her bowel movement since the last visit. No painful defectation. No abnormal vaginal bleeding. Does worry about colon cancer  The gabapentin has worked well for her pain in her back and legs decreasing it from a 5/10 to 2/10 if she takes 2 caps at a time. No drowsiness from the medication.   She will see her counsellor tomorrow and Dr Raquel James the following day. Asks if I thing Dr Raquel James should prescribe her Anaphranil, and I say she should so she can make changes as needed.   Habits & Providers  Alcohol-Tobacco-Diet     Tobacco Status: quit  Current Medications (verified): 1)  Promethazine Hcl 25 Mg Tabs (Promethazine Hcl) .... Take 1 Tab Q 6hours Prn 2)  Xanax 2 Mg Tabs (Alprazolam) .... Take 1 Tablet By Mouth Three Times A Day 3)  Anafranil 50 Mg  Caps (Clomipramine Hcl) .... Take Two Tabs in Am and 3 Tabs in P.m. 4)  Levothroid 125 Mcg Tabs (Levothyroxine Sodium) .... Take One Tablet Daily 5)  Triamcinolone Acetonide 0.1 % Crea (Triamcinolone Acetonide) .... Apply To Hands Two Times A Day As Needed 6)  Vitamin D3 1000 Unit Caps (Cholecalciferol) .... Take One Tablet Daily 7)  Omeprazole 20 Mg Cpdr (Omeprazole) .... Take One Tab  A Day As Needed 8)  Gabapentin 600 Mg Tabs (Gabapentin) .... Take One Tab 3 -4 Times Daily 9)  Aleve 220 Mg Tabs (Naproxen Sodium) .... Take 1-2 Tabs Two  Times A Day As Needed Pain  Allergies (verified): 1)  Paxil (Paroxetine Hcl)  Family History: Abd CA - MGM dx`d 8/00 Breast CA - MGA Colon CA - Mother died 07-31-1998 MI - F died,  MGF OCD - F Osteoarthritis - MGM  Physical Exam  General:  Abdominal obesity, alert.   Neck:  No deformities, masses, or tenderness noted. Breasts:  No mass, nodules, thickening, tenderness, bulging, retraction, inflamation, nipple discharge or skin changes noted.   Lungs:  Normal respiratory effort, chest expands symmetrically. Lungs are clear to auscultation, no crackles or wheezes. Heart:  Normal rate and regular rhythm. S1 and S2 normal without gallop, murmur, click, rub or other extra sounds. Abdomen:  soft and non-tender.  Marked obesity. Rectal:  No external abnormalities noted. Normal sphincter tone. No rectal masses or tenderness. No pain or bright red blood. Bwn Guaiac neg stool.  Genitalia:  normal introitus and no external lesions except a 1.5 cm polyp on the right labianormal introitus and no external lesions.  Small amount of white discharge without erythema. Non-parous cervix. Uterus and adnexae not well felt due to obesity and poor relaxation which only permitted one finger bimanual exam.  Extremities:  Normal range of motion, without edema Psych:  normally interactive, good eye contact, and moderately  anxious.     Impression & Recommendations:  Problem # 1:  FIBROMYALGIA (ICD-729.1) Back and leg pain improved on gabapentin. Will gradually increase dose.  Her updated medication list for this problem includes:    Aleve 220 Mg Tabs (Naproxen sodium) .Marland Kitchen... Take 1-2 tabs two times a day as needed pain  Orders: [facility]- Est  Level 4 ([address])  Problem # 2:  BLOOD IN STOOL (ICD-578.1) Hemacults x 3. She's to call if recurrence.  Orders: [facility]- Est  Level 4 ([address])  Problem # 3:  OBSESSIVE COMPUL. DISORDER (ICD-300.3) follow-up with Dr Raquel James,   Problem # 4:  DYSFUNCTIONAL UTERINE BLEEDING  (ICD-626.8) No longer a problem per her report, but equivocal iron studies.   Complete Medication List: 1)  Promethazine Hcl 25 Mg Tabs (Promethazine hcl) .... Take 1 tab q 6hours prn 2)  Xanax 2 Mg Tabs (Alprazolam) .... Take 1 tablet by mouth three times a day 3)  Anafranil 50 Mg Caps (Clomipramine hcl) .... Take two tabs in am and 3 tabs in p.m. 4)  Levothroid 125 Mcg Tabs (Levothyroxine sodium) .... Take one tablet daily 5)  Triamcinolone Acetonide 0.1 % Crea (Triamcinolone acetonide) .... Apply to hands two times a day as needed 6)  Vitamin D3 1000 Unit Caps (Cholecalciferol) .... Take one tablet daily 7)  Omeprazole 20 Mg Cpdr (Omeprazole) .... Take one tab  a day as needed 8)  Gabapentin 600 Mg Tabs (Gabapentin) .... Take one tab 3 -4 times daily 9)  Aleve 220 Mg Tabs (Naproxen sodium) .... Take 1-2 tabs two times a day as needed pain  Other Orders: Pap Smear-[facility] ([id number])  Patient Instructions: 1)  Increase Gabapentin to 300 mg to 2 tabs three times a day then replace it with 600 mg tab one 3 - 4 times daily.  2)  Send in the hemacult cards with a small amount of bowel movement from different days.  3)  Come in for recheck if rectal bleeding comes back.  4)  Please schedule a follow-up appointment in 1 month.  Prescriptions: GABAPENTIN 600 MG TABS (GABAPENTIN) Take one tab 3 -4 times daily  #120 x 11   Entered and Authorized by:   Zachery Dauer MD   Signed by:   Zachery Dauer MD on 01/13/2010   Method used:   [facility]        [facility]  [address] [id number]       [address]       [place], [place]  [address]       Ph: [phone]       Fax: [phone]   RxID:   [id number]    Vital Signs:  Patient profile:   50 year old female Menstrual status:  irregular Height:      61.5 inches Weight:      173 pounds BMI:     32.27 Temp:     98.2 degrees F oral Pulse rate:   91 / minute BP sitting:   132 / 85  (left arm) Cuff size:    regular  Vitals Entered By: Tessie Fass CMA (January 13, 2010 2:53 PM)  Appended Document: hemoccult card results  Laboratory Results  Date/Time Received: January 22, 2010   Date/Time Reported: January 22, 2010 5:21 PM   Stool - Occult Blood Hemmoccult #1: negative Date: 01/16/2010 Hemoccult #2: negative Date: 01/18/2010 Hemoccult #3: negative Date:  01/20/2010 Comments: ...........test performed by...........Marland KitchenTerese Door, CMA

## 2010-10-27 NOTE — Assessment & Plan Note (Signed)
Summary: f/up,tcb   Vital Signs:  Patient profile:   50 year old female Menstrual status:  irregular Height:      61.5 inches Weight:      168 pounds BMI:     31.34 Temp:     98 degrees F oral Pulse rate:   89 / minute BP sitting:   111 / 86  (right arm) Cuff size:   regular  Vitals Entered By: Tessie Fass CMA (September 30, 2009 2:51 PM) CC: F/U Is Patient Diabetic? No Pain Assessment Patient in pain? no        Primary Care Provider:  Zachery Dauer MD  CC:  F/U.  History of Present Illness: Dr Kellie Simmering diagnosis fibromyalgia. Taking Tramadol with no relief. Not taking the Ambien.   Psych appointment still not made, but promises to. It's the practice Maralyn Sago sees. Dr Waverly Ferrari is there.   Had blood with painful bowel movement last week. Constipated and has to strain to pass firm stool. Only has bowel movement every 2-3 days.  Rash right chest  gerd well controlled.  Habits & Providers  Alcohol-Tobacco-Diet     Tobacco Status: quit  Allergies: 1)  Paxil (Paroxetine Hcl)  Physical Exam  General:  Obese.  Lungs:  Normal respiratory effort, chest expands symmetrically. Lungs are clear to auscultation, no crackles or wheezes. Heart:  Normal rate and regular rhythm. S1 and S2 normal without gallop, murmur, click, rub or other extra sounds. Abdomen:  soft and non-tender.   Rectal:  No external abnormalities noted. Normal sphincter tone. No rectal masses or tenderness. Firm balls of stool in rectum. Healed fissues at 6 and 12 o'clock. No pain or bright red blood. Bwn Guaiac neg stool.  Skin:  Mild reddening and desquamation of hands "chapped"  3 cm reddened area about right lateral breast with small papules. Psych:  normally interactive, good eye contact, and slightly anxious.     Impression & Recommendations:  Problem # 1:  RECTAL FISSURE (ICD-565.0) Now healed. Needs to soften stools to prevent. Use glycerine suppositories after a meal daily Orders: [facility]- Est  Level  4 (99214) [facility] (46270)  Problem # 2:  FIBROMYALGIA (ICD-729.1) Per Dr Kellie Simmering. Encourage Psych visit to help with medication selection. The following medications were removed from the medication list:    Diclofenac Sodium 75 Mg Tbec (Diclofenac sodium) .Marland Kitchen... Take one tab two times a day Her updated medication list for this problem includes:    Aleve 220 Mg Tabs (Naproxen sodium) .Marland Kitchen... Take one tab three times a day  Orders: Vit D, 25 [facility] (35009-38182) [facility]- Est  Level 4 (99371)  Problem # 3:  OBSESSIVE COMPUL. DISORDER (ICD-300.3) Continue same meds till psych consult  Problem # 4:  HYPOTHYROIDISM, PRIMARY (ICD-244.9)  Her updated medication list for this problem includes:    Levothroid 100 Mcg Tabs (Levothyroxine sodium) .Marland Kitchen... Take one tablet daily  Orders: [facility] (69678-93810) [facility]- Est  Level 4 (17510)  Problem # 5:  ECZEMA, HANDS (ICD-692.9) Patch also left chest Her updated medication list for this problem includes:    Promethazine Hcl 25 Mg Tabs (Promethazine hcl) .Marland Kitchen... Take 1 tab q 6hours prn    Triamcinolone Acetonide 0.1 % Crea (Triamcinolone acetonide) .Marland Kitchen... Apply to hands two times a day as needed  Complete Medication List: 1)  Promethazine Hcl 25 Mg Tabs (Promethazine hcl) .... Take 1 tab q 6hours prn 2)  Xanax 2 Mg Tabs (Alprazolam) .... Take 1 tablet by mouth three times a day  3)  Anafranil 50 Mg Caps (Clomipramine hcl) .... Take two tabs in am and 3 tabs in p.m. 4)  Levothroid 100 Mcg Tabs (Levothyroxine sodium) .... Take one tablet daily 5)  Triamcinolone Acetonide 0.1 % Crea (Triamcinolone acetonide) .... Apply to hands two times a day as needed 6)  Vitamin D3 1000 Unit Caps (Cholecalciferol) .... Take one tablet daily 7)  Omeprazole 20 Mg Cpdr (Omeprazole) .... Take one tab  a day as needed 8)  Aleve 220 Mg Tabs (Naproxen sodium) .... Take one tab three times a day  Other Orders: Influenza Vaccine MCR (27062)  Patient Instructions: 1)  Eat fiber  One cereal every day. 2)  Use a glycerine suppository as directed.  Prescriptions: ANAFRANIL 50 MG  CAPS (CLOMIPRAMINE HCL) Take two tabs in AM and 3 tabs in P.M. Brand medically necessary #150 Capsule x 2   Entered and Authorized by:   Zachery Dauer MD   Signed by:   Zachery Dauer MD on 09/30/2009   Method used:   Print then Give to Patient   RxID:   [id number] XANAX 2 MG TABS (ALPRAZOLAM) Take 1 tablet by mouth three times a day  #90 x 2   Entered and Authorized by:   Zachery Dauer MD   Signed by:   Zachery Dauer MD on 09/30/2009   Method used:   Print then Give to Patient   RxID:   [id number]    Immunizations Administered:  Influenza Vaccine # 1:    Vaccine Type: Fluvax MCR    Site: left deltoid    Mfr: GlaxoSmithKline    Dose: 0.5 ml    Route: IM    Given by: Tessie Fass CMA    Exp. Date: 03/26/2010    Lot #: [id number]    VIS given: 04/20/07 version given September 30, 2009.  Flu Vaccine Consent Questions:    Do you have a history of severe allergic reactions to this vaccine? no    Any prior history of allergic reactions to egg and/or gelatin? no    Do you have a sensitivity to the preservative Thimersol? no    Do you have a past history of Guillan-Barre Syndrome? no    Do you currently have an acute febrile illness? no    Have you ever had a severe reaction to latex? no    Vaccine information given and explained to patient? yes    Are you currently pregnant? no

## 2010-10-27 NOTE — Progress Notes (Signed)
Summary: phnmsg  Phone Note Call from Patient Call back at Home Phone [phone]   Caller: Patient Summary of Call: wants a copy or something written out about the results of her MRI - she talked with Dr Sheffield Slider but doesn't remember what he said and wants it written. Initial call taken by: De Nurse,  April 23, 2010 10:27 AM    Mailed copy of Dr Martin Majestic letter of 04/15/2010  Starleen Blue RN  April 23, 2010 12:22 PM

## 2010-10-27 NOTE — Progress Notes (Signed)
  Phone Note Outgoing Call   Call placed by: Jimmy Footman, CMA,  May 26, 2010 4:02 PM Call placed to: Patient Summary of Call: LVM for patient to call back to inform of appt at Dr. Ines Bloomer office on sept 21st @ 9:30am. Patient has been seen by this doctor before    Unable to reach by phone, letter mailed. Starleen Blue RN  May 28, 2010 10:21 AM

## 2010-10-27 NOTE — Progress Notes (Signed)
----   Converted from flag ---- ---- 08/19/2010 1:45 PM, Zachery Dauer MD wrote: Please ask her to come in for fasting labs and to schedule a follow-up appointment 1-2 weeks after that. ------------------------------  called pt asked her to schedule lab appt and f/u with Dr Sheffield Slider after labs.

## 2010-10-27 NOTE — Consult Note (Signed)
Summary: Colonoscopy hemorrhoids  Colonoscopy   Imported By: De Nurse 05/29/2010 11:54:38  _____________________________________________________________________  External Attachment:    Type:   Image     Comment:   External Document

## 2010-10-27 NOTE — Letter (Signed)
Summary: Results Follow-up Letter  [facility]  [address]   [place], [place] [address]   Phone: [phone]  Fax: [phone]    04/15/2010  [address] [name], [place]  [address]  Dear Ms. Lien,   The following are the results of your recent test(s): Your back scan did not show any changes from the March 2009 scan. There are no pinched nerves that would effect your bowel control.  TPatient: Jenna Burke Note: All result statuses are Final unless otherwise noted.  Tests: (1) Iron and IBC (2390)   Iron                      51 ug/dL                    35-573   UIBC                      317 ug/dL   TIBC                      368 ug/dL                   220-254   %SAT                 [L]  14 %                        20-55  Tests: (2) CBC NO Diff (Complete Blood Count) (10000)   WBC                       6.0 K/uL                    4.0-10.5   RBC                       4.39 MIL/uL                 3.87-5.11   Hemoglobin                12.0 g/dL                   27.0-62.3   Hematocrit                36.2 %                      36.0-46.0   MCV                       82.5 fL                     78.0-100.0 ! MCH                       27.3 pg                     26.0-34.0   MCHC                      33.1 g/dL                   76.2-83.1   RDW  13.3 %                      11.5-15.5   Platelet Count            289 K/uL                    150-400 You are not anemic, but your iron is still low so I recommend taking one iron tablet daily.  Tests: (3) Sed Rate (ESR) (15010)   Sed Rate (ESR)            9 mm/hr                     0-22  No increased inflammation in your body. Tests: (4) TSH (23280)   TSH                  [L]  0.009 uIU/mL                0.350-4.500  I sent an prescription to your pharmacy for a lower dose of 112 mg since this test showed that you need less.   Document Creation Date: 04/15/2010 3:54  AM _______________________________________________________________________ Sincerely,  Zachery Dauer MD [facility]            Appended Document: Results Follow-up Letter mailed

## 2010-10-27 NOTE — Letter (Signed)
Summary: Results Follow-up Letter  [facility]  [address]   [place], [place] [address]   Phone: [phone]  Fax: [phone]    10/03/2009  [address] [name], [place]  [address]  Dear Ms. Iorio,   The following are the results of your recent test(s): Tests: (1) CBC NO Diff (Complete Blood Count) (10000)   WBC                       6.3 K/uL                    4.0-10.5   RBC                       4.32 MIL/uL                 3.87-5.11   Hemoglobin                12.0 g/dL                   57.8-46.9   Hematocrit                36.9 %                      36.0-46.0   MCV                       85.4 fL                     78.0-100.0   MCHC                      32.5 g/dL                   62.9-52.8   RDW                       14.0 %                      11.5-15.5   Platelet Count            284 K/uL                    150-400 No anemia Tests: (2) TSH (23280)   TSH                  [H]  5.496 uIU/mL                0.350-4.500     ***Test methodology is 3rd generation TSH*** You need a little higher dose of thyroid so I sent an prescription for 125 micrograms tabs to your pharmacy  Tests: (3) Vitamin D (25-Hydroxy) (41324)  Vitamin D (25-Hydroxy)                             35 ng/mL                    30-89     This assay accurately quantifies Vitamin D, which is the sum of the     25-Hydroxy forms of Vitamin D2 and D3.  Studies have shown that the     optimum concentration of 25-Hydroxy Vitamin D is 30 ng/mL or  higher.      Concentrations of Vitamin D between 20 and 29 ng/mL are consideredto     be insufficient and concentrations less than 20 ng/mL are considered     to be deficient for Vitamin D.  Your vitamin D level is good Document Creation Date: 10/01/2009 1:43 AM  Sincerely,  Zachery Dauer MD [facility]           Appended Document: Results Follow-up Letter mailed.

## 2010-10-27 NOTE — Progress Notes (Signed)
Summary: Test Res  Phone Note Call from Patient Call back at Home Phone [phone]   Caller: Patient Summary of Call: Checking on MRI results. Initial call taken by: Clydell Hakim,  April 16, 2010 2:42 PM  Follow-up for Phone Call        will forward to MD. Follow-up by: Theresia Lo RN,  April 16, 2010 3:22 PM  Additional Follow-up for Phone Call Additional follow up Details #1::        I discussed with her her minimal low back changes, normal ESR, and need for lower thyroid replacement.  Dr Waverly Ferrari added Cymbalta and I encouraged her to see the results of that before a neurology appointment and to let me evaluate her facial rash before derm referral.  Additional Follow-up by: Zachery Dauer MD,  April 16, 2010 4:01 PM    New/Updated Medications: CYMBALTA 30 MG CPEP (DULOXETINE HCL) Take one tab two times a day

## 2010-10-27 NOTE — Progress Notes (Signed)
Summary: refill  Phone Note Refill Request Call back at Home Phone [phone] Message from:  Patient  Refills Requested: Medication #1:  ANAFRANIL 50 MG  CAPS Take two tabs in AM and 3 tabs in P.M. [BMN] [facility] [address]  Initial call taken by: De Nurse,  June 25, 2010 1:48 PM

## 2010-10-27 NOTE — Progress Notes (Signed)
Summary: triage congestion  Phone Note Call from Patient Call back at Home Phone [phone]   Caller: Patient Summary of Call: body aches/congestion Initial call taken by: De Nurse,  July 01, 2010 12:28 PM  Follow-up for Phone Call        started last month. has taken tylenol & OTC.  we have no appts. she refuses to go to UC. appt at 8:30amm tomorrow. advised lenty of fluids & rest Follow-up by: Golden Circle RN,  July 01, 2010 12:29 PM  Additional Follow-up for Phone Call Additional follow up Details #1::        OK, best is for me to see her.  Additional Follow-up by: Zachery Dauer MD,  July 01, 2010 2:23 PM

## 2010-10-27 NOTE — Miscellaneous (Signed)
Summary: re: psych appt/ts  Clinical Lists Changes pt had appt with Dr.Pittman on 11-13-09 at 11am. they will fax the ov notes to you.Arlyss Repress CMA,  November 18, 2009 10:31 AM

## 2010-10-27 NOTE — Miscellaneous (Signed)
Summary: change of DM companies  Clinical Lists Changes LM for her to call back. (we got a faxed form to change to [employer] for DM items & a free back brace. if she wants to change will need an appt with pcp).Golden Circle RN  January 14, 2010 2:55 PM  Is this for Jenna Burke rather that [name]?

## 2010-10-27 NOTE — Progress Notes (Signed)
Summary: results  Phone Note Call from Patient Call back at Home Phone [phone]   Caller: Patient Summary of Call: pt is wanting to know results of pap smear Initial call taken by: De Nurse,  Jan 26, 2010 8:45 AM  Follow-up for Phone Call        left message that "everything is fine"  Follow-up by: Golden Circle RN,  Jan 26, 2010 9:08 AM

## 2010-10-27 NOTE — Progress Notes (Signed)
Summary: called pt/FYI/TS  Phone Note Call from Patient Call back at Home Phone [phone]   Caller: Patient Summary of Call: Would like to get a referral to dermatologist ([facility]) for places on her face. Initial call taken by: Clydell Hakim,  April 16, 2010 2:11 PM  Follow-up for Phone Call        CALLED PT. Advised to see Dr.Hale to assess 'the spots on her face' and then he can decide, if he wants to send her to the dermatologist. pt agreed. fwd. to Dr.Hale  Follow-up by: Arlyss Repress CMA,,  April 16, 2010 2:37 PM

## 2010-10-27 NOTE — Miscellaneous (Signed)
Summary: appt to change companies  Clinical Lists Changes states she has already changed companied. appt 02/10/10 to see pcp to get forms filled out. states she has enough supplies to get her thru until then.Golden Circle RN  January 14, 2010 4:03 PM   I think this may be meant for [facility]

## 2010-10-27 NOTE — Assessment & Plan Note (Signed)
Summary: chest congestion/[place]eadle/hale   Vital Signs:  Patient profile:   50 year old female Menstrual status:  irregular Height:      61.5 inches Weight:      163 pounds BMI:     30.41 Temp:     98.9 degrees F oral Pulse rate:   111 / minute BP sitting:   127 / 87  (left arm) Cuff size:   regular  Vitals Entered By: Jimmy Footman, CMA (July 02, 2010 8:41 AM) CC: cold sxs x1 week Is Patient Diabetic? No Comments non productive cough, nasal congestion, fever(100.0)   Primary Care Provider:  Zachery Dauer MD  CC:  cold sxs x1 week.  History of Present Illness: 1) URI symptoms: non productive cough, nasal congestion, myalgias x 1 week. Clear nasal discharge when able to blow nose. Mild sore throat. Has been taking Afrin since onset of symptoms without relief. No definite sick contact. Last seen with similar symptoms 1 month ago - those symptoms had resolved completely. Has been taking chloraseptic as well for relief of sore throat.   ROS: Denies fever, nausea, vomiting or diarrhea, appetite change, conjunctivitis, ear pain, rash, lethargy, weakness      Current Medications (verified): 1)  Xanax 2 Mg Tabs (Alprazolam) .... Take 1 Tablet By Mouth Three Times A Day 2)  Anafranil 50 Mg  Caps (Clomipramine Hcl) .... Take Two Tabs in Am and 3 Tabs in P.m. 3)  Levothroid 112 Mcg Tabs (Levothyroxine Sodium) .... Take One Tablet Daily 4)  Triamcinolone Acetonide 0.1 % Crea (Triamcinolone Acetonide) .... Apply To Hands Two Times A Day As Needed 5)  Vitamin D3 1000 Unit Caps (Cholecalciferol) .... Take One Tablet Daily 6)  Omeprazole 20 Mg Cpdr (Omeprazole) .... Take One Tab Twice Daily 7)  Aleve 220 Mg Tabs (Naproxen Sodium) .... Take 1-2 Tabs Two Times A Day As Needed Pain 8)  Cymbalta 60 Mg Cpep (Duloxetine Hcl) .... Take One Tablet Daily  Allergies (verified): 1)  Paxil (Paroxetine Hcl)  Physical Exam  General:  well appearing, obese, NAD  Head:  no sinus tenderness  Eyes:  No  conjunctival injection  Ears:  TMs clear bilaterally . Nose:  congestion and clear rhinorrhea  Mouth:  Oral mucosa and oropharynx without lesions or exudates. Neck:  no lymphadenopathy   Lungs:  Normal respiratory effort, chest expands symmetrically. Lungs are clear to auscultation, no crackles or wheezes. Heart:  Normal rate and regular rhythm. S1 and S2 normal without gallop, murmur, click, rub or other extra sounds. Abdomen:  soft and non-tender.  Marked obesity. Skin:  no rash    Impression & Recommendations:  Problem # 1:  URI (ICD-465.9) Assessment New  Likely viral. Antibiotics not indicated given duration of symptoms, benign exam. Advised regarding symptomatic management. Nasal congestion likely rebound with Afrin overuse. Advised against this as well. Follow up with PCP if not improving. To make nurse visit appointment for flu shot next week.   Her updated medication list for this problem includes:    Aleve 220 Mg Tabs (Naproxen sodium) .Marland Kitchen... Take 1-2 tabs two times a day as needed pain  Orders: [facility]- Est Level  3 (22025)  Complete Medication List: 1)  Xanax 2 Mg Tabs (Alprazolam) .... Take 1 tablet by mouth three times a day 2)  Anafranil 50 Mg Caps (Clomipramine hcl) .... Take two tabs in am and 3 tabs in p.m. 3)  Levothroid 112 Mcg Tabs (Levothyroxine sodium) .... Take one tablet daily 4)  Triamcinolone Acetonide 0.1 % Crea (Triamcinolone acetonide) .... Apply to hands two times a day as needed 5)  Vitamin D3 1000 Unit Caps (Cholecalciferol) .... Take one tablet daily 6)  Omeprazole 20 Mg Cpdr (Omeprazole) .... Take one tab twice daily 7)  Aleve 220 Mg Tabs (Naproxen sodium) .... Take 1-2 tabs two times a day as needed pain 8)  Cymbalta 60 Mg Cpep (Duloxetine hcl) .... Take one tablet daily

## 2010-10-27 NOTE — Progress Notes (Signed)
Summary: called pt/ts  Phone Note Call from Patient Call back at Home Phone [phone]   Caller: Patient Summary of Call: Wanted to talk to Dr. Sheffield Slider concerning her going to see Dr. Raquel James. Initial call taken by: Clydell Hakim,  December 25, 2009 9:02 AM  Follow-up for Phone Call        called pt. Dr.Hale is not in office. Patient wanted to know, if she should sched. pap smear. Last pap 02-2008. Advised to sched. appt with Dr.Hale for pap. Pt also reports, that she will continue to see the Psychiatrist Raquel James) Follow-up by: Arlyss Repress CMA,,  December 25, 2009 9:34 AM

## 2010-10-27 NOTE — Assessment & Plan Note (Signed)
Summary: congestion,df   Vital Signs:  Patient profile:   50 year old female Menstrual status:  irregular Height:      61.5 inches Weight:      165 pounds BMI:     30.78 Temp:     99.3 degrees F oral Pulse rate:   103 / minute BP sitting:   110 / 77  (right arm) Cuff size:   regular  Vitals Entered By: Tessie Fass CMA (June 02, 2010 3:56 PM) CC: cough, congestion, and body ache x 8 days   Primary Care Provider:  Zachery Dauer MD  CC:  cough, congestion, and and body ache x 8 days.  History of Present Illness: 8 days of sore throat nasal congestion, headache   Now sore throat, cough, gums hurts, ears ache. Yellow nasal discharge, but no facial pain.   Sarah similar symptoms   Allergies: 1)  Paxil (Paroxetine Hcl)  Physical Exam  General:  mildly ill appearing  Eyes:  NO injection Ears:  External ear exam shows no significant lesions or deformities.  Otoscopic examination reveals clear canals, tympanic membranes are intact bilaterally without bulging, retraction, inflammation or discharge. Hearing is grossly normal bilaterally. Nose:  mucosal erythema.   Mouth:  Oral mucosa and oropharynx without lesions or exudates.  Teeth in good repair. Neck:  No deformities, masses, or tenderness noted. Lungs:  Normal respiratory effort, chest expands symmetrically. Lungs are clear to auscultation, no crackles or wheezes. Heart:  Normal rate and regular rhythm. S1 and S2 normal without gallop, murmur, click, rub or other extra sounds. Skin:  4 mm verrucoid skin colored lesion dorsal left upper arm a few cm about the olecranon, with collerete of erythema, but no blistering. Cervical Nodes:  No lymphadenopathy noted Psych:  Mildly dysphoric affect.     Complete Medication List: 1)  Xanax 2 Mg Tabs (Alprazolam) .... Take 1 tablet by mouth three times a day 2)  Anafranil 50 Mg Caps (Clomipramine hcl) .... Take two tabs in am and 3 tabs in p.m. 3)  Levothroid 112 Mcg Tabs  (Levothyroxine sodium) .... Take one tablet daily 4)  Triamcinolone Acetonide 0.1 % Crea (Triamcinolone acetonide) .... Apply to hands two times a day as needed 5)  Vitamin D3 1000 Unit Caps (Cholecalciferol) .... Take one tablet daily 6)  Omeprazole 20 Mg Cpdr (Omeprazole) .... Take one tab twice daily 7)  Aleve 220 Mg Tabs (Naproxen sodium) .... Take 1-2 tabs two times a day as needed pain 8)  Cymbalta 60 Mg Cpep (Duloxetine hcl) .... Take one tablet daily  Patient Instructions: 1)  You have a respiratory virus. If nose or sinus clogs you can use generic Afrin for no more than 3 days.  2)  Call if you develop a fever over 102 or symptoms of sinus infection or pneumonia.

## 2010-10-27 NOTE — Miscellaneous (Signed)
Summary: re: Psych ref and meds/ts  Clinical Lists Changes pt said, that she is out of Xanax and Anafranil. She wants to know, if Dr.Abdurahman Rugg will refill her meds, or if Dr.Pittman should refill her medications.  Will fwd. to Dr.Myracle Febres for review .Arlyss Repress CMA,  December 09, 2009 12:29 PM I asked her to see if they will refill her prescriptions it would be better to have them in control of her psychiatric meds. Regnia says she didn't like the councellor they had working with her. She'll return to their office Apr 17th.  Medications: Added new medication of CYMBALTA 30 MG CPEP (DULOXETINE HCL) take one tab two times a day

## 2010-10-27 NOTE — Progress Notes (Signed)
Summary: Rx Req  Phone Note Refill Request Call back at Home Phone [phone] Message from:  Patient  Refills Requested: Medication #1:  XANAX 2 MG TABS Take 1 tablet by mouth three times a day   Dosage confirmed as above?Dosage Confirmed   Brand Name Necessary? Yes   Supply Requested: 1 month  Medication #2:  ANAFRANIL 50 MG  CAPS Take two tabs in AM and 3 tabs in P.M. [BMN]   Dosage confirmed as above?Dosage Confirmed   Brand Name Necessary? Yes   Supply Requested: 1 month [facility] [address] BRAND NAME MEDICALLY NECESSARY.   Method Requested: Fax to Local Pharmacy Initial call taken by: Clydell Hakim,  December 11, 2009 2:23 PM

## 2010-10-27 NOTE — Assessment & Plan Note (Signed)
Summary: flu shot/eo  Nurse Visit   Vital Signs:  Patient profile:   50 year old female Menstrual status:  irregular Temp:     99 degrees F  Vitals Entered By: Theresia Lo RN (August 12, 2010 3:40 PM)  Allergies: 1)  Paxil (Paroxetine Hcl)  Immunizations Administered:  Influenza Vaccine # 1:    Vaccine Type: Fluvax MCR    Site: left deltoid    Mfr: GlaxoSmithKline    Dose: 0.5 ml    Route: IM    Given by: Theresia Lo RN    Exp. Date: 03/27/2011    Lot #: [id number]    VIS given: 04/21/10 version given August 12, 2010.  Flu Vaccine Consent Questions:    Do you have a history of severe allergic reactions to this vaccine? no    Any prior history of allergic reactions to egg and/or gelatin? no    Do you have a sensitivity to the preservative Thimersol? no    Do you have a past history of Guillan-Barre Syndrome? no    Do you currently have an acute febrile illness? no    Have you ever had a severe reaction to latex? no    Vaccine information given and explained to patient? yes    Are you currently pregnant? no  Orders Added: 1)  Influenza Vaccine MCR [00025] 2)  Administration Flu vaccine - MCR [G0008]

## 2010-10-27 NOTE — Assessment & Plan Note (Signed)
Summary: leg pain & meds prob,df   Vital Signs:  Patient profile:   50 year old female Menstrual status:  irregular Height:      61.5 inches Weight:      174 pounds BMI:     32.46 Temp:     98.0 degrees F oral Pulse rate:   86 / minute BP sitting:   104 / 68  (left arm) Cuff size:   regular  Vitals Entered By: Tessie Fass CMA (April 14, 2010 3:19 PM) CC: F/U meds and bilateral leg pain Is Patient Diabetic? No Pain Assessment Patient in pain? yes     Location: bilateral leg Intensity: 6   Primary Care Provider:  Zachery Dauer MD  CC:  F/U meds and bilateral leg pain.  History of Present Illness: Tired. Can mow the lawn with the riding mower, but not very active in house cleaning Aching in her back and legs. Rarely drinks beer, but feels better when she does.   Will visit the psychiatrist tomorrow.   Gabapentin helps her leg pain, but causes fecal incontinence. Says it improves when she doesnt' take it.   Hasn't been taking iron for months. Amenorrheic for months.   Requests referral to a dermatologist for some spots on her face.   Didn't bring her medicines for reconcilation.  Habits & Providers  Alcohol-Tobacco-Diet     Tobacco Status: quit  Allergies: 1)  Paxil (Paroxetine Hcl)  Social History: Smoking Status:  quit  Physical Exam  General:  Marked abdominal obesity, alert.   Lungs:  Normal respiratory effort, chest expands symmetrically. Lungs are clear to auscultation, no crackles or wheezes. Heart:  Normal rate and regular rhythm. S1 and S2 normal without gallop, murmur, click, rub or other extra sounds. Rectal:  No external abnormalities noted. Normal sphincter tone. No rectal masses or tenderness. Msk:  normal ROM of back.  SLR negative Pulses:  R and L orsalis pedis and posterior tibial pulses are full and equal bilaterally Neurologic:  Perianal senstion normal. Normal anal wink Skin:  two small excoriated papules on right upper lip.  Psych:   dysphoric affect.     Impression & Recommendations:  Problem # 1:  ENCOPRESIS (ICD-307.7) Concern for cauda equina syndrome, but normal sphinter toe. MRI to ensure no neurologic deficit Orders: [facility]- Est  Level 4 (99214) [facility]- Est  Level 4 (99214)  Problem # 2:  BACK PAIN (ICD-724.5) Minimal on exam Her updated medication list for this problem includes:    Aleve 220 Mg Tabs (Naproxen sodium) .Marland Kitchen... Take 1-2 tabs two times a day as needed pain  Orders: Sed Rate (ESR)-[facility] [phone]) CBC-[facility] (91478) MRI without Contrast (MRI w/o Contrast) [facility]- Est  Level 4 (29562)  Problem # 3:  BLOOD IN STOOL (ICD-578.1) Hemacult neg today  Problem # 4:  HYPOTHYROIDISM, PRIMARY (ICD-244.9) Recheck TSH next blood draw Her updated medication list for this problem includes:    Levothroid 125 Mcg Tabs (Levothyroxine sodium) .Marland Kitchen... Take one tablet daily  Orders: [facility]- Est  Level 4 (99214) [facility]- Est  Level 4 (99214)  Problem # 5:  OBSESSIVE COMPUL. DISORDER (ICD-300.3) Psychiatric follow-up.  Orders: [facility]- Est  Level 4 (13086)  Problem # 6:  ROSACEA (ICD-695.3) May be the cause of her perioral rash  Complete Medication List: 1)  Promethazine Hcl 25 Mg Tabs (Promethazine hcl) .... Take 1 tab q 6hours prn 2)  Xanax 2 Mg Tabs (Alprazolam) .... Take 1 tablet by mouth three times a day 3)  Anafranil 50 Mg Caps (Clomipramine hcl) .... Take two tabs in am and 3 tabs in p.m. 4)  Levothroid 125 Mcg Tabs (Levothyroxine sodium) .... Take one tablet daily 5)  Triamcinolone Acetonide 0.1 % Crea (Triamcinolone acetonide) .... Apply to hands two times a day as needed 6)  Vitamin D3 1000 Unit Caps (Cholecalciferol) .... Take one tablet daily 7)  Omeprazole 20 Mg Cpdr (Omeprazole) .... Take one tab  a day as needed 8)  Gabapentin 600 Mg Tabs (Gabapentin) .... Take 2  tabs 4 times daily 9)  Aleve 220 Mg Tabs (Naproxen sodium) .... Take 1-2 tabs two times a day as needed pain  Other Orders: TSH-[facility]  ([id number]) Iron -[facility] [phone]) Iron Binding Cap (TIBC)-[facility] ([address])  Patient Instructions: 1)  Please schedule a follow-up appointment in 1 month.

## 2010-10-27 NOTE — Miscellaneous (Signed)
Summary: prob list rev   

## 2010-10-27 NOTE — Assessment & Plan Note (Signed)
Summary: places on face,tcb   Vital Signs:  Patient profile:   50 year old female Menstrual status:  irregular Height:      61.5 inches Weight:      168 pounds BMI:     31.34 Temp:     98.0 degrees F oral Pulse rate:   98 / minute BP sitting:   115 / 74  (right arm) Cuff size:   regular  Vitals Entered By: Jimmy Footman, CMA (May 26, 2010 2:30 PM) CC: would like referral to rheumatologist & dematologist Is Patient Diabetic? No   Primary Care Provider:  Zachery Dauer MD  CC:  would like referral to rheumatologist & dematologist.  History of Present Illness: Skin lesion left distal upper arm is irritated and wishes it removed. Wants a referral to a dermatologist.  Wants a referral back to Dr Kellie Simmering. The Gabapentin helps her pain, but changes the consistency of her stool so that it is difficult to clean herself. Her recent colonoscopy by Dr Elnoria Howard was normal except for hemorrhoides. Maralyn Sago has shared some of ther Tresa Garter which helped her pain some. Pain currently 7/10 shoulders and low back.   Dr Raquel James has increased her Cymbalta to 60 mg daily but it hasn't helped her pain.   Habits & Providers  Alcohol-Tobacco-Diet     Tobacco Status: quit  Allergies: 1)  Paxil (Paroxetine Hcl)  Physical Exam  Skin:  4 mm verrucoid skin colored lesion dorsal left upper arm a few cm about the olecranon Psych:  Mildly dysphoric affect.     Impression & Recommendations:  Problem # 1:  VERRUCA VULGARIS (ICD-078.10) return if needs retreated Orders: [facility]- Est Level  3 (16109)  Problem # 2:  BACK PAIN (ICD-724.5)  Her updated medication list for this problem includes:    Aleve 220 Mg Tabs (Naproxen sodium) .Marland Kitchen... Take 1-2 tabs two times a day as needed pain  Orders: Rheumatology Referral (Rheumatology) [facility]- Est Level  3 (60454)  Problem # 3:  BLOOD IN STOOL (ICD-578.1) Normal colonoscopy per patient  Problem # 4:  OBSESSIVE COMPUL. DISORDER (ICD-300.3) Will see her psychiatrist again  soon.  Complete Medication List: 1)  Xanax 2 Mg Tabs (Alprazolam) .... Take 1 tablet by mouth three times a day 2)  Anafranil 50 Mg Caps (Clomipramine hcl) .... Take two tabs in am and 3 tabs in p.m. 3)  Levothroid 112 Mcg Tabs (Levothyroxine sodium) .... Take one tablet daily 4)  Triamcinolone Acetonide 0.1 % Crea (Triamcinolone acetonide) .... Apply to hands two times a day as needed 5)  Vitamin D3 1000 Unit Caps (Cholecalciferol) .... Take one tablet daily 6)  Omeprazole 20 Mg Cpdr (Omeprazole) .... Take one tab twice daily 7)  Aleve 220 Mg Tabs (Naproxen sodium) .... Take 1-2 tabs two times a day as needed pain 8)  Cymbalta 60 Mg Cpep (Duloxetine hcl) .... Take one tablet daily  Patient Instructions: 1)  Apply bandaid and antibiotic ointment to "wart" left upper arm after if develops a blister. Return for retreatment if it is still there after the blister peals off.  2)  Please schedule a follow-up appointment in 1 month.   Procedure Note Last Tetanus: Done. (11/26/2002)  Wart Removal: Onset of lesion: > 3 months Indication: inflamed lesion Consent signed: no  Procedure # 1: cryotherapy    Region: dorsal    Location: arm-upper-left    # lesions removed: 1    Technique: cryotherapy    Comment: freeze thaw x 3

## 2010-10-27 NOTE — Miscellaneous (Signed)
Summary: Declined to sign lumbar orthosis fax  Clinical Lists Changes Form faxed back to [employer]. "No indication for this orthosis"

## 2010-10-27 NOTE — Consult Note (Signed)
Summary: [facility]  [facility]   Imported By: Clydell Hakim 11/19/2009 14:29:32  _____________________________________________________________________  External Attachment:    Type:   Image     Comment:   External Document

## 2010-10-29 NOTE — Assessment & Plan Note (Signed)
Summary: f/u & mobility exam,df   Vital Signs:  Patient profile:   50 year old female Menstrual status:  irregular Height:      61.5 inches Weight:      162 pounds BMI:     30.22 Temp:     98.2 degrees F oral Pulse rate:   93 / minute BP sitting:   131 / 89  (left arm) Cuff size:   regular  Vitals Entered By: Tessie Fass CMA (September 15, 2010 3:44 PM) CC: F/U Is Patient Diabetic? No Pain Assessment Patient in pain? yes     Location: bilateral leg Intensity: 10   Primary Care Provider:  Zachery Dauer MD  CC:  F/U.  History of Present Illness: Feels weak, anxious, aching all over. OK last evening, worse today. Flashes of feeling hot and perspiring. Irregular spotting of dark menstrual blood. Friend recommended hydromorphone for pain. Dr Kellie Simmering had prescribed Prednisone, but she stopped it after a few days due to anxiety.   A complany has been sending me requests for back brace, mattress overlay, and scooter for her, which I stated are not indicated. She says they made these suggestions to her.   She stopped her thyroid medication a couple months ago thinking it was making her feel bad. Also stopped the Cymbalta suddenly a couple months ago due to palpitations. Feels cold when not having a flash. Skin dry. Face puffy.   Hasn't fallen. Chest hurts at times.   Habits & Providers  Alcohol-Tobacco-Diet     Tobacco Status: quit  Current Medications (verified): 1)  Xanax 2 Mg Tabs (Alprazolam) .... Take 1 Tablet By Mouth Three Times A Day 2)  Anafranil 50 Mg  Caps (Clomipramine Hcl) .... Take Two Tabs in Am and 3 Tabs in P.m. 3)  Levothroid 112 Mcg Tabs (Levothyroxine Sodium) .... Take One Tablet Daily 4)  Triamcinolone Acetonide 0.1 % Crea (Triamcinolone Acetonide) .... Apply To Hands Two Times A Day As Needed 5)  Vitamin D3 1000 Unit Caps (Cholecalciferol) .... Take One Tablet Daily 6)  Omeprazole 20 Mg Cpdr (Omeprazole) .... Take One Tab Twice Daily 7)  Aleve 220 Mg Tabs  (Naproxen Sodium) .... Take 1-2 Tabs Two Times A Day As Needed Pain  Allergies (verified): 1)  Paxil (Paroxetine Hcl)  Physical Exam  General:  well appearing, obese, NAD  Neck:  No thyroid enlargement Lungs:  Normal respiratory effort, chest expands symmetrically. Lungs are clear to auscultation, no crackles or wheezes. Heart:  Normal rate and regular rhythm. S1 and S2 normal without gallop, murmur, click, rub or other extra sounds. Abdomen:  soft and non-tender.  Marked obesity. Msk:  normal ROM of back.  SLR negative Extremities:  Normal range of motion, without edema Neurologic:  gait normal except slow and DTRs symmetrical and normal.   Psych:  More dysphoric affect.     Impression & Recommendations:  Problem # 1:  ABNORMALITY OF GAIT (ICD-781.2) mainly disuse weakness. I encouraged walking. Will check ESR and CK Orders: B12-[facility] ([id number])  Problem # 2:  SYMPTOMATIC MENOPAUSAL/FEMALE CLIMACTERIC STATES (ICD-627.2)  Orders: [facility]- Est  Level 4 (84166) FSH-[facility] ([id number])  Problem # 3:  HYPOTHYROIDISM, PRIMARY (ICD-244.9) May have myopathy from this. Emphasized the improtance of taking this medication. Start with half dose for one week. I'll call her next week with lab results and to see how she is tolerating the levothroid.  Her updated medication list for this problem includes:    Levothroid 112 Mcg Tabs (Levothyroxine sodium) .Marland KitchenMarland KitchenMarland KitchenMarland Kitchen  Take one tablet daily  Orders: [facility]- Est  Level 4 (10272)  Problem # 4:  FIBROMYALGIA (ICD-729.1) Emphasized the need for exercise Her updated medication list for this problem includes:    Aleve 220 Mg Tabs (Naproxen sodium) .Marland Kitchen... Take 1-2 tabs two times a day as needed pain  Orders: CK (Creatine Kinase)-[facility] [phone]) Sed Rate (ESR)-[facility] (85651) [facility]- Est  Level 4 (99214) Comp Met-[facility] ([id number])  Problem # 5:  OBESITY, NOS (ICD-278.00) Encouraged weight loss.  Orders: [facility]- Est  Level 4 (64332)  Complete Medication  List: 1)  Xanax 2 Mg Tabs (Alprazolam) .... Take 1 tablet by mouth three times a day 2)  Anafranil 50 Mg Caps (Clomipramine hcl) .... Take two tabs in am and 3 tabs in p.m. 3)  Levothroid 112 Mcg Tabs (Levothyroxine sodium) .... Take one tablet daily 4)  Triamcinolone Acetonide 0.1 % Crea (Triamcinolone acetonide) .... Apply to hands two times a day as needed 5)  Vitamin D3 1000 Unit Caps (Cholecalciferol) .... Take one tablet daily 6)  Omeprazole 20 Mg Cpdr (Omeprazole) .... Take one tab twice daily 7)  Aleve 220 Mg Tabs (Naproxen sodium) .... Take 1-2 tabs two times a day as needed pain  Other Orders: Vit D, 25 OH-[facility] ([id number])  Patient Instructions: 1)  Take one half of the thyroid pill daily for one week then one daily.  2)  I will call you next week with your lab results.  3)  Please schedule a follow-up appointment in 1 month.    Orders Added: 1)  CK (Creatine Kinase)-[facility] [[id number]] 2)  Sed Rate (ESR)-[facility] [85651] 3)  [facility]- Est  Level 4 [99214] 4)  FSH-[facility] [[id number]] 5)  Comp Met-[facility] [80053-22900] 6)  B12-[facility] [[id number]] 7)  Vit D, 25 OH-[facility] [[id number]]

## 2010-10-29 NOTE — Progress Notes (Signed)
Summary: NO POWER CHAIR NEEDED/TS  Phone Note From Other Clinic Call back at [phone]   Caller: [facility] Summary of Call: checking status of paperwork for power chair Initial call taken by: Knox Royalty,  September 24, 2010 3:53 PM  Follow-up for Phone Call        SEE PREVIOUS NOTE FROM DR.HALE. NO POWER CHAIR NEEDED.PT AGREED. Follow-up by: Arlyss Repress CMA,,  September 24, 2010 5:10 PM

## 2010-10-29 NOTE — Letter (Signed)
Summary: Results Follow-up Letter  [facility]  [address]   [place], [place] [address]   Phone: [phone]  Fax: [phone]    09/16/2010  [address] [name], [place]  [address]  Dear [name],   The following are the results of your recent test(s): Patient: Jenna Burke Good test results. I think you'll feel better awhile after getting your thyroid dose up to normal.  Tests: (1) CMP with Estimated GFR (2402)   Sodium                    139 mEq/L                   135-145   Potassium                 4.2 mEq/L                   3.5-5.3   Chloride                  103 mEq/L                   96-112   CO2                       25 mEq/L                    19-32   Glucose                   82 mg/dL                    75-64   BUN                       10 mg/dL                    3-32   Creatinine                0.63 mg/dL                  0.40-1.20   Bilirubin, Total          0.3 mg/dL                   9.5-1.8   Alkaline Phosphatase      52 U/L                      39-117   AST/SGOT                  17 U/L                      0-37   ALT/SGPT                  18 U/L                      0-35   Total Protein             6.9 g/dL                    8.4-1.6   Albumin                   4.4 g/dL  3.5-5.2   Calcium                   9.1 mg/dL                   9.5-28.4 ! Est GFR, African American  Your kidney and liver function are good. Tests: (2) Sed Rate (ESR) (15010)   Sed Rate (ESR)            7 mm/hr                     0-22 No indication of inflamatory disease. Tests: (3) CK, Total (13244)   CK, Total                 46 U/L                      7-177 No muscle inflamation to cause your aches.   Tests: (4) Vitamin B12 (01027)   Vitamin B12               579 pg/mL                   211-911  Tests: (5) FSH (25366)   FSH                       102.6 mIU/mL     Reference Ranges:              Female:                          1.4 -  18.1 mIU/mL              Female:   Follicular Phase    2.5 -  10.2 mIU/mL                        MidCycle Peak       3.4 -  33.4 mIU/mL                        Luteal Phase        1.5 -   9.1 mIU/mL                        Post Menopausal    23.0 - 116.3 mIU/mL   You are in Menopause, we'll discuss treatment when I call next week.                        Tests: (6) Vitamin D (25-Hydroxy) (44034)  Vitamin D (25-Hydroxy)                             32 ng/mL                    30-89 Continue your current vitamin D dose.  Sincerely,  Zachery Dauer MD [facility]           Appended Document: Results Follow-up Letter mailed

## 2010-10-29 NOTE — Miscellaneous (Signed)
Summary: Request for Motorized Wheelchair  Clinical Lists Changes Ms. Manard is coming for an appt on 12/20.  Spoke with rep with the vending co. Ms. Rog wanted a motorized chair from and told her to fax back the forms she had originally sent.  Explained the procedure and why denied.  I will place them in your box to be seen when Ms. Voorheis arrive for her appt. Abundio Miu  September 11, 2010 10:14 AM  On exam today, no justification found for any of the equipment they requested. They had called and requested to the patient that she needed them

## 2010-10-29 NOTE — Miscellaneous (Signed)
Summary: Declined again to sign orthosis fax  Form returned to [employer]

## 2010-11-04 ENCOUNTER — Other Ambulatory Visit: Payer: Self-pay | Admitting: Family Medicine

## 2010-11-04 DIAGNOSIS — E039 Hypothyroidism, unspecified: Secondary | ICD-10-CM

## 2010-11-05 NOTE — Telephone Encounter (Signed)
Please review and refill

## 2010-11-10 ENCOUNTER — Ambulatory Visit: Payer: Self-pay | Admitting: Family Medicine

## 2010-11-19 ENCOUNTER — Other Ambulatory Visit: Payer: Self-pay | Admitting: Family Medicine

## 2010-11-20 NOTE — Telephone Encounter (Signed)
Please review and refill

## 2010-12-17 ENCOUNTER — Other Ambulatory Visit: Payer: Self-pay | Admitting: Family Medicine

## 2010-12-17 NOTE — Telephone Encounter (Signed)
Refill request

## 2010-12-18 NOTE — Telephone Encounter (Signed)
Refill request

## 2011-01-12 ENCOUNTER — Ambulatory Visit
Admission: RE | Admit: 2011-01-12 | Discharge: 2011-01-12 | Disposition: A | Discharge status: Home / Self Care | Payer: Medicaid Other | Source: Ambulatory Visit | Attending: Family Medicine | Admitting: Family Medicine

## 2011-01-12 ENCOUNTER — Encounter: Payer: Self-pay | Admitting: Family Medicine

## 2011-01-12 ENCOUNTER — Other Ambulatory Visit: Payer: Self-pay | Admitting: Family Medicine

## 2011-01-12 DIAGNOSIS — Z1231 Encounter for screening mammogram for malignant neoplasm of breast: Secondary | ICD-10-CM

## 2011-01-13 ENCOUNTER — Encounter: Payer: Self-pay | Admitting: Family Medicine

## 2011-01-13 ENCOUNTER — Ambulatory Visit: Payer: Self-pay

## 2011-01-13 ENCOUNTER — Other Ambulatory Visit: Payer: Self-pay | Admitting: Family Medicine

## 2011-01-14 ENCOUNTER — Ambulatory Visit: Payer: Self-pay

## 2011-01-14 ENCOUNTER — Encounter: Payer: Self-pay | Admitting: Family Medicine

## 2011-01-14 ENCOUNTER — Ambulatory Visit (INDEPENDENT_AMBULATORY_CARE_PROVIDER_SITE_OTHER): Payer: Medicare Other | Admitting: Family Medicine

## 2011-01-14 VITALS — BP 143/88 | HR 96 | Temp 98.1°F | Ht 61.5 in | Wt 168.0 lb

## 2011-01-14 DIAGNOSIS — N951 Menopausal and female climacteric states: Secondary | ICD-10-CM

## 2011-01-14 DIAGNOSIS — K130 Diseases of lips: Secondary | ICD-10-CM | POA: Insufficient documentation

## 2011-01-14 DIAGNOSIS — F429 Obsessive-compulsive disorder, unspecified: Secondary | ICD-10-CM

## 2011-01-14 DIAGNOSIS — L710 Perioral dermatitis: Secondary | ICD-10-CM

## 2011-01-14 DIAGNOSIS — L259 Unspecified contact dermatitis, unspecified cause: Secondary | ICD-10-CM

## 2011-01-14 DIAGNOSIS — L719 Rosacea, unspecified: Secondary | ICD-10-CM

## 2011-01-14 DIAGNOSIS — N949 Unspecified condition associated with female genital organs and menstrual cycle: Secondary | ICD-10-CM

## 2011-01-14 DIAGNOSIS — E039 Hypothyroidism, unspecified: Secondary | ICD-10-CM

## 2011-01-14 DIAGNOSIS — IMO0001 Reserved for inherently not codable concepts without codable children: Secondary | ICD-10-CM

## 2011-01-14 MED ORDER — LEVOTHYROXINE SODIUM 112 MCG PO TABS: 112.0000 ug | ORAL_TABLET | Freq: Every day | ORAL | Status: DC

## 2011-01-14 MED ORDER — METRONIDAZOLE 0.75 % EX GEL: Freq: Two times a day (BID) | CUTANEOUS | Status: AC

## 2011-01-14 MED ORDER — KETOCONAZOLE-HYDROCORTISONE 2 & 1 % (CREAM) EX KIT: 1.0000 "application " | PACK | Freq: Three times a day (TID) | CUTANEOUS | Status: DC

## 2011-01-14 MED ORDER — GABAPENTIN 800 MG PO TABS: 800.0000 mg | ORAL_TABLET | Freq: Three times a day (TID) | ORAL | Status: DC

## 2011-01-14 NOTE — Assessment & Plan Note (Signed)
Improved on triamcinolone

## 2011-01-14 NOTE — Progress Notes (Signed)
  Subjective:    Patient ID: Jenna Burke, female    DOB: October 09, 1960, 50 y.o.   MRN: [id number]  HPISkin: complains of dry skin on legs and back, scaled papule R dorsal wrist, cyst on L upper chest is bothering her, pimples on nose, irritated patch R angle of mouth. Roommate takes Erythromycin  Thyroid: hasn't taken the levothyroid past 2 months because she thought it might have side effects. Feels hot rather than cold. Not constipated but is tired.  Menstrual: Continues amenorrheic with hot flashes. Roommate reports irritability.  Psych: Hasn't been cleaning excessively. Saw Dr Waverly Ferrari who didn't change her medications, but did set up counseling in her office.  Fibromyalgia: Pain in legs and back. Would like to see Dr Corliss Skains, rheum. Gabapentin qid is not making her sleepy.  GERD: occasionally wet burp.     Review of Systems     Objective:   Physical Exam  Constitutional: She appears well-nourished.  Neck: No thyromegaly present.  Cardiovascular: Normal rate, regular rhythm and normal heart sounds.   Pulmonary/Chest: Effort normal and breath sounds normal.  Abdominal: She exhibits no distension and no mass. There is no tenderness. There is no rebound and no guarding.       Abdominal obesity  Musculoskeletal: Normal range of motion.       SLR normal bilaterally  Skin:       1 cm diameter epidermal inclusion cyst lateral to left upper sternum. Not inflamed.    3 mm keratosis dorsum of the right wrist  Dry desquamating skin over the lower legs and back  Psychiatric: She has a normal mood and affect. Her behavior is normal.          Assessment & Plan:

## 2011-01-14 NOTE — Telephone Encounter (Signed)
Refill request

## 2011-01-14 NOTE — Assessment & Plan Note (Signed)
Dry skin and fatigue likely resulting from being off replacement for 2 months. She says she will restart it.

## 2011-01-14 NOTE — Assessment & Plan Note (Signed)
Will try increased Gabapentin and schedule the rheumatology consult that she requested.

## 2011-01-14 NOTE — Assessment & Plan Note (Signed)
Well-controlled with Omeprazole 

## 2011-01-14 NOTE — Patient Instructions (Signed)
Apply the ketaconazole cream to the R lip rash til it's gone Apply the metronidazole gel to your nose rash til the next visit Increase Gabapentin to 800 mg 3-4 times daily  I will make the referral to the rheumatologist  Return to see Dr Sheffield Slider in 2 - 3 weeks for removal of the cyst on your chest and the spot on your R wrist.

## 2011-01-14 NOTE — Assessment & Plan Note (Signed)
Managed by psychiatrist, Dr Waverly Ferrari.

## 2011-01-14 NOTE — Assessment & Plan Note (Signed)
May be responsible for mood swings, but need to get thyroid replacement stabilized before considering perimenopausal estrogen replacement.

## 2011-01-14 NOTE — Assessment & Plan Note (Signed)
Continues amenorrheic with hot flashes.

## 2011-01-15 ENCOUNTER — Encounter: Payer: Self-pay | Admitting: Family Medicine

## 2011-01-15 NOTE — Progress Notes (Signed)
Addended by: Zachery Dauer on: 01/15/2011 12:11 PM   Modules accepted: Orders

## 2011-01-19 ENCOUNTER — Telehealth: Payer: Self-pay | Admitting: Family Medicine

## 2011-01-19 NOTE — Telephone Encounter (Signed)
Pt is calling re: the gel she was prescribed, says shes not sure why she would use that on the place on her mouth. Pt asking to speak with RN

## 2011-01-19 NOTE — Telephone Encounter (Signed)
She was concerned that the med md ordered was a vaginal med & states she has not had any oral sex. Told her it had nothing too do with sex. The med is used in places on the body other than the vagina. Encouraged her to keep using it.  Then said her roommate has an antibiotic for her rosacea & she wants the same thing. Told her I will let the md know this but she should continue the med she has. She has made an appt for 2-3 weeks for other skin issues. Says the area on her lip has spread to chin & she is embarrassed by this. Told her I will notify the md & will call her if he wants to change meds or see her again

## 2011-01-20 MED ORDER — DOXYCYCLINE HYCLATE 100 MG PO TABS: 100.0000 mg | ORAL_TABLET | Freq: Every day | ORAL | Status: AC

## 2011-01-20 NOTE — Assessment & Plan Note (Signed)
Patient called back requesting the antibiotic for Rosacea because her perioral rash is spreading. Will start Docycycline which she took in the past for Hidradenitis.

## 2011-01-20 NOTE — Telephone Encounter (Signed)
Pt called again this am and is concerned about the problem spreading.  Wants to know if there is anything that can be done about it.  Wants to talk to Dr Sheffield Slider or his nurse.

## 2011-01-20 NOTE — Progress Notes (Signed)
Addended by: Zachery Dauer on: 01/20/2011 03:23 PM   Modules accepted: Orders

## 2011-01-21 ENCOUNTER — Telehealth: Payer: Self-pay | Admitting: Family Medicine

## 2011-01-21 NOTE — Telephone Encounter (Signed)
Pt is concerned that the med given for skin condition is not helping. Problem is escalating further on face from mouth.  Please call her and advise as what else can be used to counter this.

## 2011-01-21 NOTE — Telephone Encounter (Signed)
I returned her call and left a message that Doxycycline had been sent to her pharmacy yesterday.

## 2011-01-26 ENCOUNTER — Telehealth: Payer: Self-pay | Admitting: Family Medicine

## 2011-01-26 DIAGNOSIS — E039 Hypothyroidism, unspecified: Secondary | ICD-10-CM

## 2011-01-26 NOTE — Assessment & Plan Note (Signed)
complains of feeling hyper from the thyroid medication, even when she tried a half dose. I explained that shouldn't happen, but we can use a beta blocker for a short time since her blood pressure was a little high.

## 2011-01-26 NOTE — Telephone Encounter (Signed)
Jenna Burke said the med prescribed for her thyroid is causing her to become to hyper.  She wanted to know if you could order lesser mg or a different med for this.

## 2011-01-27 ENCOUNTER — Encounter: Payer: Self-pay | Admitting: Family Medicine

## 2011-01-27 ENCOUNTER — Ambulatory Visit (INDEPENDENT_AMBULATORY_CARE_PROVIDER_SITE_OTHER): Payer: Medicare Other | Admitting: Family Medicine

## 2011-01-27 VITALS — BP 127/89 | HR 85 | Ht 62.0 in | Wt 164.4 lb

## 2011-01-27 DIAGNOSIS — E039 Hypothyroidism, unspecified: Secondary | ICD-10-CM

## 2011-01-27 DIAGNOSIS — L723 Sebaceous cyst: Secondary | ICD-10-CM

## 2011-01-27 DIAGNOSIS — L089 Local infection of the skin and subcutaneous tissue, unspecified: Secondary | ICD-10-CM | POA: Insufficient documentation

## 2011-01-27 MED ORDER — CEPHALEXIN 500 MG PO CAPS: 500.0000 mg | ORAL_CAPSULE | Freq: Three times a day (TID) | ORAL | Status: DC

## 2011-01-27 MED ORDER — PROPRANOLOL HCL 20 MG PO TABS: 20.0000 mg | ORAL_TABLET | Freq: Two times a day (BID) | ORAL | Status: DC

## 2011-01-27 NOTE — Progress Notes (Signed)
  Subjective:    Patient ID: Jenna Burke, female    DOB: [birth year], 50 y.o.   MRN: [id number]  HPIShe started the Doxycycline for her face yesterday. She squeezed the L upper chest sebacious cyst 2 days ago and much smelly material out, but by the following morning it was tender with spreading redness. Felt chilly last evening.  Cut her thyroid pill in half, but still feels anxious with palpitations.  Was putting the ketaconazole/triamcinlone on her nose and metrogel beside her mouth. The nose is improved. Didn't get the Doxycycline until yesterday.     Review of Systems     Objective:   Physical Exam  Skin:          R upper inner quadrant cyst with dark pore, with 2.5x3 cm induration and 6 x 13 cm erythema and tenderness   After anesthesia with one cc xylocaine 1% with epinephrine, the pore area was incised 1 cm and a small amount of pus and sebaceous material was expressed. The cyst was packed with 1/4 in packing tape.        Assessment & Plan:

## 2011-01-27 NOTE — Patient Instructions (Signed)
Please come back Friday AM when Dr Sheffield Slider is precepting to have the packing removed. Take both antibiotics, Doxycycline and Cephalexin until you get the culture report. Come back sooner if you develop high fever or vomiting  Continue your thyroid pill twice daily and take the Propanolol twice daily to block the palpitations.

## 2011-01-27 NOTE — Assessment & Plan Note (Signed)
Likely the Doxycycline hasn't had time to reverse the cellulitis spread, but will add Cephalexin to cover strep

## 2011-01-29 ENCOUNTER — Ambulatory Visit (INDEPENDENT_AMBULATORY_CARE_PROVIDER_SITE_OTHER): Payer: Medicare Other | Admitting: Family Medicine

## 2011-01-29 VITALS — BP 110/76 | HR 69 | Temp 98.1°F | Ht 62.0 in | Wt 164.0 lb

## 2011-01-29 DIAGNOSIS — R52 Pain, unspecified: Secondary | ICD-10-CM

## 2011-01-29 MED ORDER — ACETAMINOPHEN 500 MG PO TABS
1000.0000 mg | ORAL_TABLET | Freq: Once | ORAL | Status: AC
  Administered 2011-01-29: 1000 mg via ORAL

## 2011-01-29 NOTE — Progress Notes (Signed)
  Subjective:    Patient ID: Jenna Burke, female    DOB: March 05, 1961, 50 y.o.   MRN: [id number]  HPIHere for follow up of the infected epidermal cyst with cellulitis that I drained 2 days ago. She hasn't had fever or chills, but her upper chest still hurts when moving around because breast movement pulls on it. She hasn't taken any pain medication this AM and asks for something. The packing came out yesterday with a packing change and there has been some drainage of pus since.  She did start the Propanolol and took the full dose of levothyroid this AM. No palpitations so far    Review of Systems     Objective:   Physical ExamMinimally ill appearing. Moves without much apparent discomfort. The erythema has retreated to about 4 x 6 cm in size with a 3x3 area of induration surrounding the incision opening. With gentle pressure and probing a couple more cc of pus and a tiny bit of sebaceous material were expressed.          Assessment & Plan:  Infected epidermal cyst with surrounding cellulitis. The retreating cellulitis indicates one of the two antibiotics is effective. C&S is pending. She may gently press on the cyst twice daily with wound changes and apply antibiotic to the opening with dressing changes. Recheck as scheduled on May 8th, sooner if she fails to continue inproving. Acetaminophen 500 mg 2 tabs were given to the patient to take immediately

## 2011-01-29 NOTE — Patient Instructions (Signed)
Keep your appointment next Tuesday with Dr Sheffield Slider for follow up of the infected cyst.  Take Acetaminophen 500 mg four times daily as needed for pain.

## 2011-02-01 LAB — WOUND CULTURE

## 2011-02-02 ENCOUNTER — Encounter: Payer: Self-pay | Admitting: Family Medicine

## 2011-02-02 ENCOUNTER — Ambulatory Visit (INDEPENDENT_AMBULATORY_CARE_PROVIDER_SITE_OTHER): Payer: Medicare Other | Admitting: Family Medicine

## 2011-02-02 VITALS — BP 104/76 | HR 75 | Temp 98.0°F | Ht 62.0 in | Wt 161.9 lb

## 2011-02-02 DIAGNOSIS — L723 Sebaceous cyst: Secondary | ICD-10-CM

## 2011-02-02 DIAGNOSIS — L089 Local infection of the skin and subcutaneous tissue, unspecified: Secondary | ICD-10-CM

## 2011-02-02 DIAGNOSIS — B079 Viral wart, unspecified: Secondary | ICD-10-CM

## 2011-02-02 DIAGNOSIS — K130 Diseases of lips: Secondary | ICD-10-CM

## 2011-02-02 DIAGNOSIS — E039 Hypothyroidism, unspecified: Secondary | ICD-10-CM

## 2011-02-02 NOTE — Assessment & Plan Note (Signed)
Cellulitis resolved. Cyst inflammation persists.

## 2011-02-02 NOTE — Patient Instructions (Signed)
Continue the Doxycycline and Propanolol until they are gone  Return in 3 -4 weeks for surgery to remove the chest cyst. Sooner if it worsens.

## 2011-02-02 NOTE — Progress Notes (Signed)
  Subjective:    Patient ID: Jenna Burke, female    DOB: 10-10-1960, 50 y.o.   MRN: [id number]  HPI the skin redness has improved but the cyst area is still tender.  now able to express minimal amounts of thin bloody fluid. No fever or chills.  She continues taking the full dose Synthroid with propranolol. Not have any palpitations or excessive anxiety.   She would like the wartlike lesion on her right forearm frozen off    Review of Systems     Objective:   Physical Exam The erythema of her chest has resolved except for the 3 centimeter area around the incision site where it is still indurated and mildly tender. Nothing can now be expressed from the incision site.    3 x 4 mm slightly raised warty papule on the right dorsal forearm was frozen and thawed to 3 times using liquid nitrogen. He was informed that this will likely blister and thereafter she should keep antibiotic ointment on it.    Assessment & Plan:

## 2011-02-02 NOTE — Assessment & Plan Note (Signed)
improved

## 2011-02-02 NOTE — Assessment & Plan Note (Signed)
Vs solar keratosis. Treated with liquid N2. Recheck next visit

## 2011-02-02 NOTE — Assessment & Plan Note (Signed)
Recheck TSH in 6 weeks if continues full dose levothyroid

## 2011-02-04 NOTE — Telephone Encounter (Signed)
Dr. Sheffield Slider, was this every addressed?

## 2011-02-05 NOTE — Telephone Encounter (Signed)
I've called twice, but no answer. Left  Message to call and leave a message about when she is going to be available.

## 2011-02-10 ENCOUNTER — Telehealth: Payer: Self-pay | Admitting: Family Medicine

## 2011-02-10 DIAGNOSIS — L719 Rosacea, unspecified: Secondary | ICD-10-CM

## 2011-02-10 NOTE — Telephone Encounter (Signed)
Wants a referral to a Dermatologist for rash on mouth and a few other things - Dr Terri Piedra is one she prefers

## 2011-02-23 ENCOUNTER — Ambulatory Visit (INDEPENDENT_AMBULATORY_CARE_PROVIDER_SITE_OTHER): Payer: Medicare Other | Admitting: Family Medicine

## 2011-02-23 ENCOUNTER — Encounter: Payer: Self-pay | Admitting: Family Medicine

## 2011-02-23 VITALS — BP 110/70 | HR 81 | Temp 98.3°F | Ht 62.0 in | Wt 167.0 lb

## 2011-02-23 DIAGNOSIS — K921 Melena: Secondary | ICD-10-CM

## 2011-02-23 DIAGNOSIS — E039 Hypothyroidism, unspecified: Secondary | ICD-10-CM

## 2011-02-23 DIAGNOSIS — L719 Rosacea, unspecified: Secondary | ICD-10-CM

## 2011-02-23 DIAGNOSIS — J029 Acute pharyngitis, unspecified: Secondary | ICD-10-CM

## 2011-02-23 DIAGNOSIS — J069 Acute upper respiratory infection, unspecified: Secondary | ICD-10-CM | POA: Insufficient documentation

## 2011-02-23 NOTE — Progress Notes (Signed)
  Subjective:    Patient ID: Jenna Burke, female    DOB: May 27, 1961, 50 y.o.   MRN: [id number]  HPI returns for followup of her sebaceous cyst. Although there remains 1cm thickened area at the incision site she does not feel is any sign of recurrence.   The rash on her right upper lip persists and she asked that we complete a referral to the dermatologist  Today she complains of a sore throat that she's had for about a week with nasal congestion and dry and slight cough it is improving she has not had fever or chills her partner Maralyn Sago had similar symptoms which went on to a bronchitis  She's not had any vaginal or rectal bleeding but is concerned that she may again have anemia. She has not been taking supplemental iron for some time  She is taking her full dose Synthroid which she says has been she's been taking for 4 weeks also taking propranolol twice daily. Despite that she remains very hyper    Review of Systems     Objective:   Physical Exam  HENT:  Right Ear: External ear normal.  Left Ear: External ear normal.  Nose: Nose normal.  Mouth/Throat: Oropharynx is clear and moist.  Eyes: Conjunctivae are normal.  Neck: No thyromegaly present.  Cardiovascular: Normal rate and regular rhythm.   Pulmonary/Chest: Effort normal and breath sounds normal.  Lymphadenopathy:    She has no cervical adenopathy.  Skin: Rash noted.       1x2 cm reddened patch R upper lip  1 cm area of thickening and slight redness with no tenderness or drainage at the upper sternal cyst incision site.           Assessment & Plan:

## 2011-02-23 NOTE — Patient Instructions (Signed)
I will call with lab results. Continue medications the same until then. Return for removal if the cyst starts growing again.

## 2011-02-24 ENCOUNTER — Telehealth: Payer: Self-pay | Admitting: Family Medicine

## 2011-02-24 LAB — CBC
HCT: 36.4 % (ref 36.0–46.0)
MCH: 28.2 pg (ref 26.0–34.0)
MCHC: 33 g/dL (ref 30.0–36.0)
MCV: 85.6 fL (ref 78.0–100.0)
RDW: 14 % (ref 11.5–15.5)

## 2011-02-24 LAB — TSH: TSH: 2.825 u[IU]/mL (ref 0.350–4.500)

## 2011-02-24 NOTE — Assessment & Plan Note (Signed)
Will check TSH today and call her the results
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
## 2011-02-24 NOTE — Assessment & Plan Note (Signed)
None recently and no vaginal bleeding. She would like a CBC to make sure she is OK not taking iron

## 2011-02-24 NOTE — Assessment & Plan Note (Signed)
Much improved but she still wants to see Dr Terri Piedra about the reddened area on R upper lip

## 2011-02-24 NOTE — Assessment & Plan Note (Signed)
Self limited. Symptomatic treatment

## 2011-03-27 ENCOUNTER — Other Ambulatory Visit: Payer: Self-pay | Admitting: Family Medicine

## 2011-03-27 NOTE — Telephone Encounter (Signed)
Refill request

## 2011-04-20 ENCOUNTER — Ambulatory Visit (INDEPENDENT_AMBULATORY_CARE_PROVIDER_SITE_OTHER): Payer: Medicare Other | Admitting: Family Medicine

## 2011-04-20 ENCOUNTER — Telehealth: Payer: Self-pay | Admitting: Family Medicine

## 2011-04-20 ENCOUNTER — Encounter: Payer: Self-pay | Admitting: Family Medicine

## 2011-04-20 DIAGNOSIS — S41159A Open bite of unspecified upper arm, initial encounter: Secondary | ICD-10-CM

## 2011-04-20 DIAGNOSIS — L723 Sebaceous cyst: Secondary | ICD-10-CM

## 2011-04-20 DIAGNOSIS — T148XXA Other injury of unspecified body region, initial encounter: Secondary | ICD-10-CM

## 2011-04-20 DIAGNOSIS — L719 Rosacea, unspecified: Secondary | ICD-10-CM

## 2011-04-20 DIAGNOSIS — E559 Vitamin D deficiency, unspecified: Secondary | ICD-10-CM

## 2011-04-20 DIAGNOSIS — W540XXA Bitten by dog, initial encounter: Secondary | ICD-10-CM

## 2011-04-20 DIAGNOSIS — E039 Hypothyroidism, unspecified: Secondary | ICD-10-CM

## 2011-04-20 DIAGNOSIS — S41109A Unspecified open wound of unspecified upper arm, initial encounter: Secondary | ICD-10-CM

## 2011-04-20 DIAGNOSIS — F410 Panic disorder [episodic paroxysmal anxiety] without agoraphobia: Secondary | ICD-10-CM

## 2011-04-20 DIAGNOSIS — IMO0001 Reserved for inherently not codable concepts without codable children: Secondary | ICD-10-CM

## 2011-04-20 DIAGNOSIS — L089 Local infection of the skin and subcutaneous tissue, unspecified: Secondary | ICD-10-CM

## 2011-04-20 MED ORDER — ALPRAZOLAM 2 MG PO TABS: ORAL_TABLET | ORAL | Status: DC

## 2011-04-20 NOTE — Telephone Encounter (Signed)
Called pt. Pt reports, that Dr.Pamela Raquel James will not prescribe the Brand Name Xanax for her. She received #90 Xanax (generic) on 04-14-11 and also has Rx for next month from Dr.Pittman. Per pt: Dr.Pittman does not rx Brand Name Xanax, because of the trouble of PA. I explained to the pt, that it is very important to take her Xanax, even if it is generic.Marland Kitchenit is dangerous to d/c Xanax. Pt is aware of this.  I advised the pt to discuss this issue with Dr.Pittman. Pt feels like it would not make a difference, because she explained it already why she would need the Brand name only. Encouraged pt to stay with Dr.Pittman. Pt's friend will call us back and schedule appt with Dr.Hale and also bring her in for her appt. Pt's anxiety was high due to this issue. I finally was able to calm her down and she will take the generic Xanax now. She wanted to be sure, that Dr.Hale knows about this and I reassured her, that I would inform him. [name], [name]

## 2011-04-20 NOTE — Patient Instructions (Signed)
Return in 3 months, sooner if needed   Gradually increase your exercise and your exposure to social situations.

## 2011-04-20 NOTE — Assessment & Plan Note (Addendum)
Carisoprodil has been effective taking prn. This was prescribed by Dr. Raquel James but I will continue her on the medication.

## 2011-04-20 NOTE — Telephone Encounter (Signed)
Will not be able to make the appt today because of transportation but would like to speak with Dr. Sheffield Slider.  She also wants a Rx for Xanax.

## 2011-04-21 NOTE — Assessment & Plan Note (Signed)
well controlled  

## 2011-04-21 NOTE — Progress Notes (Signed)
  Subjective:    Patient ID: Jenna Burke, female    DOB: [birth year], 50 y.o.   MRN: [id number]  HPI she is upset because Dr.Pittman, psychiatrist declined to do a preauthorization for brand-name Xanax. She continues to believe that the generic form does not adequately treat her anxiety symptoms. She reports that she is taking her Anafranil at that is still brand-name. She denies doing compulsive cleaning recently but reports continued thinks that anxiety preventing her going into stores with large amounts of people   She got between her dogs 4 days ago was bitten on the left forearm. it is sore, but she's had no fever, increased redness or other signs of infection. Her last tetanus shot was 8 years ago.   Is taking her thyroid medication and is off propranolol.   She continues taking the gabapentin which does help her myalgias. Dr. Raquel James added Tresa Garter which has also helped  Her lip and nasal dermatitis is improved on the MetroGel.  Hand dermatitis has not been symptomatic this summer. The site of the cyst removal was injected with steroid by Dr Terri Piedra and has nearly healed    Review of Systems  Genitourinary: Positive for menstrual problem (occasional vaginal spotting).       Objective:   Physical Exam  Neck: No thyromegaly present.  Cardiovascular: Normal rate and regular rhythm.   Pulmonary/Chest: Effort normal and breath sounds normal.  Abdominal: Soft. Bowel sounds are normal. She exhibits no mass. There is no tenderness.       Moderately obese  Musculoskeletal:       Slumped, head forward posture sitting  Bite marks with skin broken superficially on the proximal dorsal and distal volar left forearm, no significant erythema or other signs of infection. Normal motion in the  the left forearm  Neurological: She displays normal reflexes.  Skin:       Minimal erythema of dorsal hands  Psychiatric:       Mildly anxious Negative responses to most suggestions           Assessment & Plan:

## 2011-04-21 NOTE — Telephone Encounter (Signed)
I discussed this with her in her office visit and said that I agree with Dr Waverly Ferrari that the Brand Name of this medication is not justifiable.

## 2011-04-21 NOTE — Assessment & Plan Note (Signed)
Signs of infection.

## 2011-04-21 NOTE — Assessment & Plan Note (Signed)
Continues to be a problem. I encouraged her to make another appointment with Dr. Raquel James or her counselor to discuss ways to manage her anxiety symptoms. Encourage her to get regular exercise to help with this. I recommended that she gradually decrease her social interactions to decrease her phobia in this area.

## 2011-04-27 ENCOUNTER — Ambulatory Visit: Payer: Medicare Other | Admitting: Family Medicine

## 2011-06-14 ENCOUNTER — Other Ambulatory Visit: Payer: Self-pay | Admitting: Family Medicine

## 2011-06-14 NOTE — Telephone Encounter (Signed)
Refill request

## 2011-09-08 ENCOUNTER — Other Ambulatory Visit: Payer: Self-pay | Admitting: Family Medicine

## 2011-09-08 NOTE — Telephone Encounter (Signed)
Refill request

## 2011-09-14 ENCOUNTER — Ambulatory Visit: Payer: Medicare Other | Admitting: Family Medicine

## 2011-10-12 ENCOUNTER — Ambulatory Visit: Payer: Medicare Other | Admitting: Family Medicine

## 2011-11-18 ENCOUNTER — Other Ambulatory Visit: Payer: Self-pay | Admitting: Family Medicine

## 2011-11-18 NOTE — Telephone Encounter (Signed)
Form faxed to insurance. Patient notified.

## 2011-11-18 NOTE — Telephone Encounter (Signed)
Form is in MD box for completion. Message left on patient's voicemail  of this .

## 2011-11-18 NOTE — Telephone Encounter (Signed)
Forward to [place] for prior authorization.Jenna Burke, Jenna Burke

## 2011-11-18 NOTE — Telephone Encounter (Signed)
  ANAFRANIL   Patient is calling for a refill on this medication sent to [facility] on [address].  They need Dr. Sheffield Slider to get Medicaid prior authorization in order for the insurance to pay since the Generic won't work for her.  She would like a call from the nurse about this as well.

## 2011-11-24 NOTE — Telephone Encounter (Signed)
Called insurance to check on the status of PA and was told Anafranil has been approved from 11/11/2011  until 11/19/2012. Patient and  pharmacy notified.

## 2011-12-14 ENCOUNTER — Telehealth: Payer: Self-pay | Admitting: Family Medicine

## 2011-12-14 DIAGNOSIS — F0630 Mood disorder due to known physiological condition, unspecified: Secondary | ICD-10-CM | POA: Diagnosis not present

## 2011-12-14 NOTE — Telephone Encounter (Signed)
Patient states she has been getting generic Xanax from her psychiatrist Dr. Einar Grad.  States the generic does not work  . She has hot flashes, didn't want to go out or take a shower.  Dr. Raquel James refused to do a PA for the brand Xanax. She is asking if Dr. Sheffield Slider will send in PA for Xanax.. Advised will send message to ask.

## 2011-12-14 NOTE — Telephone Encounter (Signed)
Patient is calling about her Xanax.  She has a question about her side effects.

## 2011-12-16 NOTE — Telephone Encounter (Signed)
Dr. Sheffield Slider states he will not be able to do PA for brand name Xanax. Patient notified . Advised to  schedule appointment to follow up with PCP. She will call back.

## 2012-01-07 ENCOUNTER — Other Ambulatory Visit: Payer: Self-pay | Admitting: Family Medicine

## 2012-01-08 NOTE — Telephone Encounter (Signed)
Her TSH is due next month and is needed before further refills

## 2012-02-01 DIAGNOSIS — F0630 Mood disorder due to known physiological condition, unspecified: Secondary | ICD-10-CM | POA: Diagnosis not present

## 2012-02-24 DIAGNOSIS — F0630 Mood disorder due to known physiological condition, unspecified: Secondary | ICD-10-CM | POA: Diagnosis not present

## 2012-03-06 ENCOUNTER — Other Ambulatory Visit: Payer: Self-pay | Admitting: Family Medicine

## 2012-03-21 DIAGNOSIS — F0630 Mood disorder due to known physiological condition, unspecified: Secondary | ICD-10-CM | POA: Diagnosis not present

## 2012-05-04 ENCOUNTER — Other Ambulatory Visit: Payer: Self-pay | Admitting: Family Medicine

## 2012-05-24 ENCOUNTER — Emergency Department (HOSPITAL_COMMUNITY)
Admission: EM | Admit: 2012-05-24 | Discharge: 2012-05-24 | Disposition: A | Discharge status: Home / Self Care | Payer: Medicare Other | Attending: Emergency Medicine | Admitting: Emergency Medicine

## 2012-05-24 ENCOUNTER — Emergency Department (HOSPITAL_COMMUNITY): Payer: Medicare Other

## 2012-05-24 ENCOUNTER — Encounter (HOSPITAL_COMMUNITY): Payer: Self-pay | Admitting: *Deleted

## 2012-05-24 DIAGNOSIS — Z23 Encounter for immunization: Secondary | ICD-10-CM | POA: Insufficient documentation

## 2012-05-24 DIAGNOSIS — S51009A Unspecified open wound of unspecified elbow, initial encounter: Secondary | ICD-10-CM | POA: Diagnosis not present

## 2012-05-24 DIAGNOSIS — Y92009 Unspecified place in unspecified non-institutional (private) residence as the place of occurrence of the external cause: Secondary | ICD-10-CM | POA: Insufficient documentation

## 2012-05-24 DIAGNOSIS — Z203 Contact with and (suspected) exposure to rabies: Secondary | ICD-10-CM | POA: Insufficient documentation

## 2012-05-24 DIAGNOSIS — S61409A Unspecified open wound of unspecified hand, initial encounter: Secondary | ICD-10-CM | POA: Insufficient documentation

## 2012-05-24 DIAGNOSIS — Z79899 Other long term (current) drug therapy: Secondary | ICD-10-CM | POA: Diagnosis not present

## 2012-05-24 DIAGNOSIS — S61209A Unspecified open wound of unspecified finger without damage to nail, initial encounter: Secondary | ICD-10-CM | POA: Diagnosis not present

## 2012-05-24 DIAGNOSIS — W540XXA Bitten by dog, initial encounter: Secondary | ICD-10-CM | POA: Insufficient documentation

## 2012-05-24 MED ORDER — TETANUS-DIPHTH-ACELL PERTUSSIS 5-2.5-18.5 LF-MCG/0.5 IM SUSP
INTRAMUSCULAR | Status: AC
  Administered 2012-05-24: 0.5 mL via INTRAMUSCULAR
  Filled 2012-05-24: qty 0.5

## 2012-05-24 MED ORDER — AMOXICILLIN-POT CLAVULANATE 500-125 MG PO TABS: 1.0000 | ORAL_TABLET | Freq: Three times a day (TID) | ORAL | Status: AC

## 2012-05-24 MED ORDER — RABIES IMMUNE GLOBULIN 150 UNIT/ML IM INJ
20.0000 [IU]/kg | INJECTION | Freq: Once | INTRAMUSCULAR | Status: AC
  Administered 2012-05-24: 1425 [IU] via INTRAMUSCULAR
  Filled 2012-05-24: qty 9.5

## 2012-05-24 MED ORDER — RABIES VACCINE, PCEC IM SUSR
1.0000 mL | Freq: Once | INTRAMUSCULAR | Status: AC
  Administered 2012-05-24: 1 mL via INTRAMUSCULAR
  Filled 2012-05-24: qty 1

## 2012-05-24 MED ORDER — TRAMADOL HCL 50 MG PO TABS: 50.0000 mg | ORAL_TABLET | Freq: Four times a day (QID) | ORAL | Status: DC | PRN

## 2012-05-24 NOTE — ED Provider Notes (Signed)
History   This chart was scribed for Nelia Shi, MD by Sofie Rower. The patient was seen in room [facility] and the patient's care was started at 2:19PM    CSN: [id number]  Arrival date & time 05/24/12  1337   None     Chief Complaint  Patient presents with  . Animal Bite    (Consider location/radiation/quality/duration/timing/severity/associated sxs/prior treatment) Patient is a 51 y.o. female presenting with animal bite. The history is provided by the patient and a relative. No language interpreter was used.  Animal Bite  The incident occurred just prior to arrival. The incident occurred at home. She came to the ER via EMS. There is an injury to the right elbow, right hand and left hand. The pain is moderate. It is unknown if a foreign body is present. Pertinent negatives include no numbness, no nausea, no vomiting, no headaches and no tingling. There have been no prior injuries to these areas. Her tetanus status is unknown. There were no sick contacts.    ARELYN GAUER is a 51 y.o. female who, brought by EMS, presents to the Emergency Department complaining of  sudden, moderate, animal bite located at the left hand, right hand, and right elbow onset today. The pt reports she was attacked by a wild dog this afternoon, just prior to arrival. The pt informs that the dog ran away and animal control has not yet been contacted.  The pt does not smoke or drink alcohol.   PCP is Dr. Sheffield Slider.   Past Medical History  Diagnosis Date  . Esophageal reflux disease 10/1999    EGD showed esophagitis  . Fracture of metatarsal bone of left foot 08/2003    3&4  . 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 Biopsy endometrial 07/1991  . Biopsy endometrial 06/2004    secretory  . Perineal hidradenitis excision 12/2002    incision and drainage  . Colonoscopy 623/2006    normal    Family History  Problem Relation Age of  Onset  . Cancer Mother     colon  . Heart attack Father   . OCD Father   . Osteoarthritis Maternal Grandmother     History  Substance Use Topics  . Smoking status: Former Smoker    Quit date: 12/15/2010  . Smokeless tobacco: Never Used  . Alcohol Use: No    OB History    Grav Para Term Preterm Abortions TAB SAB Ect Mult Living                  Review of Systems  Gastrointestinal: Negative for nausea and vomiting.  Neurological: Negative for tingling, numbness and headaches.  All other systems reviewed and are negative.    Allergies  Codeine and Paroxetine  Home Medications   Current Outpatient Rx  Name Route Sig Dispense Refill  . ALPRAZOLAM 2 MG PO TABS Oral Take 2 mg by mouth 3 (three) times daily as needed. For anxiety    . ANAFRANIL 50 MG PO CAPS  TAKE 2 CAPSULE BY MOUTH EVERY MORNING AND 3 CAPSULES BY MOUTH IN THE EVENING 150 capsule 11    Dispense as written.  Marland Kitchen CARISOPRODOL 350 MG PO TABS Oral Take 350 mg by mouth 4 (four) times daily as needed. For muscle relaxant    . GABAPENTIN 800 MG PO TABS  TAKE 1 TABLET BY MOUTH 3 TIMES A DAY  90 tablet 11  . IBUPROFEN 200 MG PO TABS Oral Take 400 mg by mouth 2 (two) times daily as needed. For pain    . LEVOTHYROXINE SODIUM 112 MCG PO TABS  TAKE 1 TABLET BY MOUTH DAILY 30 tablet 1  . PROMETHAZINE HCL 25 MG PO TABS Oral Take 25 mg by mouth every 6 (six) hours as needed. For nausea    . TRIAMCINOLONE ACETONIDE 0.1 % EX CREA Topical Apply 1 application topically 2 (two) times daily. Apply to hands    . AMOXICILLIN-POT CLAVULANATE 500-125 MG PO TABS Oral Take 1 tablet (500 mg total) by mouth every 8 (eight) hours. 21 tablet 0  . TRAMADOL HCL 50 MG PO TABS Oral Take 1 tablet (50 mg total) by mouth every 6 (six) hours as needed for pain. 15 tablet 0    BP 124/85  Pulse 104  Temp 98.3 F (36.8 C) (Oral)  Resp 20  SpO2 92%  Physical Exam  Nursing note and vitals reviewed. Constitutional: She is oriented to person, place,  and time. She appears well-developed. No distress.  HENT:  Head: Normocephalic and atraumatic.  Eyes: Pupils are equal, round, and reactive to light.  Neck: Normal range of motion.  Cardiovascular: Normal rate and intact distal pulses.   Pulmonary/Chest: No respiratory distress.  Abdominal: Normal appearance. She exhibits no distension.  Musculoskeletal: Normal range of motion.  Neurological: She is alert and oriented to person, place, and time. No cranial nerve deficit.  Skin: Skin is warm and dry. No rash noted.       Multiple bite wounds noted on left and right hand.  No significant bleeding.  Bite wound noted on right elbow.  No significant bleeding.  Some bruising noted to the left hand and right index finger.  Psychiatric: She has a normal mood and affect. Her behavior is normal.    ED Course  Procedures (including critical care time) Scheduled Meds:   . rabies immune globulin  20 Units/kg Intramuscular Once  . rabies vaccine, PCEC  1 mL Intramuscular Once  . TDaP       Continuous Infusions:  PRN Meds:.  DIAGNOSTIC STUDIES: Oxygen Saturation is 92% on room air, low by my interpretation.    COORDINATION OF CARE:    2:22PM- Antibiotics, pain management, and x-ray discussed. Pt agrees with treatment.   Labs Reviewed - No data to display Dg Hand Complete Left  05/24/2012  *RADIOLOGY REPORT*  Clinical Data: Animal bite to the left hand, laceration  LEFT HAND - COMPLETE 3+ VIEW  Comparison: None.  Findings: Fingers are incompletely splayed on the lateral projection. No fracture or dislocation.  No soft tissue abnormality.  No radiopaque foreign body.  IMPRESSION: No acute osseous abnormality.   Original Report Authenticated By: Harrel Lemon, M.D.    Dg Finger Index Right  05/24/2012  *RADIOLOGY REPORT*  Clinical Data: Animal bite, finger laceration  RIGHT INDEX FINGER 2+V  Comparison: None.  Findings: There is a punctate radiopaque foreign body projecting over the soft  tissues dorsal to the middle phalanx of the right second digit.  Possible overlying laceration, with bandage material noted.  No fracture or dislocation.  IMPRESSION: Possible radiopaque foreign body over the dorsal soft tissues of the right second digit middle phalanx.   Original Report Authenticated By: Harrel Lemon, M.D.       1. Dog bite of multiple sites       MDM  Lungs were cleansed and dressed.  Patient was  given initial rabies vaccine and immunoglobulin.  Instructed to followup at [facility] urgent care for remaining rabies vaccines.  Patient instructed to contact animal control.  If the dog is located and can be quarantined then remaining vaccine doses may not need to be administered.       I personally performed the services described in this documentation, which was scribed in my presence. The recorded information has been reviewed and considered.    Nelia Shi, MD 05/24/12 [phone]

## 2012-05-24 NOTE — ED Notes (Signed)
Pt states she was attacked by a wild dog just pta. Pt with laceration to left hand, right hand, and right elbow. Upon EMS arrival bleeding controlled.

## 2012-05-27 ENCOUNTER — Emergency Department (INDEPENDENT_AMBULATORY_CARE_PROVIDER_SITE_OTHER)
Admission: EM | Admit: 2012-05-27 | Discharge: 2012-05-27 | Disposition: A | Discharge status: Home / Self Care | Payer: Medicare Other | Source: Home / Self Care | Attending: Family Medicine | Admitting: Family Medicine

## 2012-05-27 ENCOUNTER — Encounter (HOSPITAL_COMMUNITY): Payer: Self-pay | Admitting: *Deleted

## 2012-05-27 DIAGNOSIS — Z23 Encounter for immunization: Secondary | ICD-10-CM | POA: Diagnosis not present

## 2012-05-27 DIAGNOSIS — L02519 Cutaneous abscess of unspecified hand: Secondary | ICD-10-CM | POA: Diagnosis not present

## 2012-05-27 DIAGNOSIS — L03119 Cellulitis of unspecified part of limb: Secondary | ICD-10-CM | POA: Diagnosis not present

## 2012-05-27 MED ORDER — RABIES VACCINE, PCEC IM SUSR
1.0000 mL | Freq: Once | INTRAMUSCULAR | Status: AC
  Administered 2012-05-27: 1 mL via INTRAMUSCULAR

## 2012-05-27 MED ORDER — DICLOFENAC SODIUM 75 MG PO TBEC: 50.0000 mg | DELAYED_RELEASE_TABLET | Freq: Two times a day (BID) | ORAL | Status: DC

## 2012-05-27 MED ORDER — CEFTRIAXONE SODIUM 1 G IJ SOLR
INTRAMUSCULAR | Status: AC
  Filled 2012-05-27: qty 10

## 2012-05-27 MED ORDER — LIDOCAINE HCL (PF) 1 % IJ SOLN
INTRAMUSCULAR | Status: AC
  Filled 2012-05-27: qty 5

## 2012-05-27 MED ORDER — CEFTRIAXONE SODIUM 1 G IJ SOLR
1000.0000 mg | Freq: Once | INTRAMUSCULAR | Status: AC
  Administered 2012-05-27: 1000 mg via INTRAMUSCULAR

## 2012-05-27 MED ORDER — RABIES VACCINE, PCEC IM SUSR
INTRAMUSCULAR | Status: AC
  Filled 2012-05-27: qty 1

## 2012-05-27 NOTE — ED Provider Notes (Signed)
History     CSN: [id number]  Arrival date & time 05/27/12  1246   First MD Initiated Contact with Patient 05/27/12 1440      Chief Complaint  Patient presents with  . Rabies Injection  . Wound Check    (Consider location/radiation/quality/duration/timing/severity/associated sxs/prior treatment) HPI Comments: This is a f/u of a set of dog bite wounds to the upper extremities. She is also to receive rabies vaccination series. Is taking Augmentin and Ultram that makes her feel bad and sweat. No fevers or chills.   The history is provided by the patient.    Past Medical History  Diagnosis Date  . Esophageal reflux disease 10/1999    EGD showed esophagitis  . Fracture of metatarsal bone of left foot 08/2003    3&4  . 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 Biopsy endometrial 07/1991  . Biopsy endometrial 06/2004    secretory  . Perineal hidradenitis excision 12/2002    incision and drainage  . Colonoscopy 623/2006    normal    Family History  Problem Relation Age of Onset  . Cancer Mother     colon  . Heart attack Father   . OCD Father   . Osteoarthritis Maternal Grandmother     History  Substance Use Topics  . Smoking status: Former Smoker    Quit date: 12/15/2010  . Smokeless tobacco: Never Used  . Alcohol Use: No    OB History    Grav Para Term Preterm Abortions TAB SAB Ect Mult Living                  Review of Systems  Constitutional: Negative for fever, chills and diaphoresis.  HENT: Negative for facial swelling.   Cardiovascular: Negative for leg swelling.  Musculoskeletal: Positive for joint swelling. Negative for back pain and gait problem.  Skin: Positive for color change and wound.  Neurological: Negative for dizziness and syncope.  Psychiatric/Behavioral: The patient is nervous/anxious.     Allergies  Codeine and Paroxetine  Home Medications   Current Outpatient  Rx  Name Route Sig Dispense Refill  . ALPRAZOLAM 2 MG PO TABS Oral Take 2 mg by mouth 3 (three) times daily as needed. For anxiety    . AMOXICILLIN-POT CLAVULANATE 500-125 MG PO TABS Oral Take 1 tablet (500 mg total) by mouth every 8 (eight) hours. 21 tablet 0  . ANAFRANIL 50 MG PO CAPS  TAKE 2 CAPSULE BY MOUTH EVERY MORNING AND 3 CAPSULES BY MOUTH IN THE EVENING 150 capsule 11    Dispense as written.  Marland Kitchen CARISOPRODOL 350 MG PO TABS Oral Take 350 mg by mouth 4 (four) times daily as needed. For muscle relaxant    . DICLOFENAC SODIUM 75 MG PO TBEC Oral Take 1 tablet (75 mg total) by mouth 2 (two) times daily. With food as needed for pain. 20 tablet 0  . GABAPENTIN 800 MG PO TABS  TAKE 1 TABLET BY MOUTH 3 TIMES A DAY 90 tablet 11  . IBUPROFEN 200 MG PO TABS Oral Take 400 mg by mouth 2 (two) times daily as needed. For pain    . LEVOTHYROXINE SODIUM 112 MCG PO TABS  TAKE 1 TABLET BY MOUTH DAILY 30 tablet 1  . PROMETHAZINE HCL 25 MG PO TABS Oral Take 25 mg by mouth every 6 (six) hours as needed. For nausea    . TRAMADOL  HCL 50 MG PO TABS Oral Take 1 tablet (50 mg total) by mouth every 6 (six) hours as needed for pain. 15 tablet 0  . TRIAMCINOLONE ACETONIDE 0.1 % EX CREA Topical Apply 1 application topically 2 (two) times daily. Apply to hands      BP 123/88  Pulse 85  Temp 98.5 F (36.9 C) (Oral)  Resp 18  SpO2 100%  Physical Exam  Constitutional: She appears well-developed and well-nourished.  Neck: Normal range of motion. Neck supple.  Pulmonary/Chest: No respiratory distress.  Skin: Skin is warm. She is not diaphoretic. There is erythema.       Edema of the L hand with multiple puncture wounds. %th digit with deeper erythema and swelling. Erythema extends into forearm. R elbow with 2 separate 1cm lacerations from bite with surrounding tenderness and erythema.     ED Course  Procedures (including critical care time)  Labs Reviewed - No data to display No results found.   1.  Cellulitis and abscess of hand       MDM  Received rabies vac Wound chks as above. There is quite a bit of redness and swelling as previously mentioned but has had <2 full days of the Augmentin. Continue that. Adm Rocephin 1gm IM now and RTO 4 d as scheduled for rechk. Advised if becomes ill, fever, sickly worsening of redness or swelling go to the ED.               Hayden Rasmussen, NP 05/27/12 2124

## 2012-05-27 NOTE — ED Notes (Signed)
Pt here for 2nd rabies vaccine request physician examine wounds

## 2012-05-27 NOTE — ED Notes (Signed)
Pt dressings removed left hand with swelling redness/ left 5th finger open wound/bruising and puncture wounds right elbow - NP examining wounds

## 2012-05-29 NOTE — ED Provider Notes (Signed)
Medical screening examination/treatment/procedure(s) were performed by non-physician practitioner and as supervising physician I was immediately available for consultation/collaboration.  Luiz Blare MD   Luiz Blare, MD 05/29/12 2245

## 2012-05-31 ENCOUNTER — Emergency Department (INDEPENDENT_AMBULATORY_CARE_PROVIDER_SITE_OTHER)
Admission: EM | Admit: 2012-05-31 | Discharge: 2012-05-31 | Disposition: A | Discharge status: Home / Self Care | Payer: Medicare Other | Source: Home / Self Care | Attending: Emergency Medicine | Admitting: Emergency Medicine

## 2012-05-31 ENCOUNTER — Encounter (HOSPITAL_COMMUNITY): Payer: Self-pay

## 2012-05-31 ENCOUNTER — Encounter (HOSPITAL_COMMUNITY): Payer: Self-pay | Admitting: Emergency Medicine

## 2012-05-31 DIAGNOSIS — M7989 Other specified soft tissue disorders: Secondary | ICD-10-CM | POA: Diagnosis not present

## 2012-05-31 DIAGNOSIS — Z8489 Family history of other specified conditions: Secondary | ICD-10-CM | POA: Insufficient documentation

## 2012-05-31 DIAGNOSIS — Z87891 Personal history of nicotine dependence: Secondary | ICD-10-CM | POA: Insufficient documentation

## 2012-05-31 DIAGNOSIS — Z23 Encounter for immunization: Secondary | ICD-10-CM

## 2012-05-31 DIAGNOSIS — M659 Unspecified synovitis and tenosynovitis, unspecified site: Secondary | ICD-10-CM | POA: Diagnosis not present

## 2012-05-31 DIAGNOSIS — Z800 Family history of malignant neoplasm of digestive organs: Secondary | ICD-10-CM | POA: Insufficient documentation

## 2012-05-31 DIAGNOSIS — Z888 Allergy status to other drugs, medicaments and biological substances status: Secondary | ICD-10-CM | POA: Insufficient documentation

## 2012-05-31 DIAGNOSIS — S61209A Unspecified open wound of unspecified finger without damage to nail, initial encounter: Secondary | ICD-10-CM | POA: Diagnosis not present

## 2012-05-31 DIAGNOSIS — K219 Gastro-esophageal reflux disease without esophagitis: Secondary | ICD-10-CM | POA: Insufficient documentation

## 2012-05-31 DIAGNOSIS — Z8261 Family history of arthritis: Secondary | ICD-10-CM | POA: Insufficient documentation

## 2012-05-31 DIAGNOSIS — Z8249 Family history of ischemic heart disease and other diseases of the circulatory system: Secondary | ICD-10-CM | POA: Insufficient documentation

## 2012-05-31 DIAGNOSIS — M6510 Other infective (teno)synovitis, unspecified site: Secondary | ICD-10-CM

## 2012-05-31 DIAGNOSIS — L089 Local infection of the skin and subcutaneous tissue, unspecified: Secondary | ICD-10-CM | POA: Diagnosis not present

## 2012-05-31 MED ORDER — RABIES VACCINE, PCEC IM SUSR
INTRAMUSCULAR | Status: AC
  Filled 2012-05-31: qty 1

## 2012-05-31 MED ORDER — RABIES VACCINE, PCEC IM SUSR
1.0000 mL | Freq: Once | INTRAMUSCULAR | Status: AC
  Administered 2012-05-31: 1 mL via INTRAMUSCULAR

## 2012-05-31 NOTE — ED Notes (Signed)
Dog bite August 28; been tx by MD; sent over here for left ring finger infection. Finger has purulent drainage.

## 2012-05-31 NOTE — ED Provider Notes (Signed)
At the History     CSN: [id number]  Arrival date & time 05/31/12  1846   First MD Initiated Contact with Patient 05/31/12 2002      Chief Complaint  Patient presents with  . Rabies Injection    (Consider location/radiation/quality/duration/timing/severity/associated sxs/prior treatment) HPI Comments: Patient presents urgent care tonight to receive a third rabies injection from her series, was called by the nurses patient is complaining that her fifth finger on her left hand is becoming swollen more red and tender. Patient also sustained other wounds to her right elbow and left hand they seem to be healing okay. Patient describes taking both medicines prescribed to her which includes Augmentin and tramadol since August 28. At this point patient describes that is unable to move her finger. Denies any fevers or other constitutional symptoms such as, changes in appetite, arthralgias or myalgias,  The history is provided by the patient and a relative.    Past Medical History  Diagnosis Date  . Esophageal reflux disease 10/1999    EGD showed esophagitis  . Fracture of metatarsal bone of left foot 08/2003    3&4  . 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 Biopsy endometrial 07/1991  . Biopsy endometrial 06/2004    secretory  . Perineal hidradenitis excision 12/2002    incision and drainage  . Colonoscopy 623/2006    normal    Family History  Problem Relation Age of Onset  . Cancer Mother     colon  . Heart attack Father   . OCD Father   . Osteoarthritis Maternal Grandmother     History  Substance Use Topics  . Smoking status: Former Smoker    Quit date: 12/15/2010  . Smokeless tobacco: Never Used  . Alcohol Use: No    OB History    Grav Para Term Preterm Abortions TAB SAB Ect Mult Living                  Review of Systems  Constitutional: Negative for fever, chills, activity change and  appetite change.  Skin: Positive for color change and wound. Negative for pallor and rash.    Allergies  Codeine and Paroxetine  Home Medications   Current Outpatient Rx  Name Route Sig Dispense Refill  . ALPRAZOLAM 2 MG PO TABS Oral Take 2 mg by mouth 3 (three) times daily as needed. For anxiety    . AMOXICILLIN-POT CLAVULANATE 500-125 MG PO TABS Oral Take 1 tablet (500 mg total) by mouth every 8 (eight) hours. 21 tablet 0  . ANAFRANIL 50 MG PO CAPS  TAKE 2 CAPSULE BY MOUTH EVERY MORNING AND 3 CAPSULES BY MOUTH IN THE EVENING 150 capsule 11    Dispense as written.  Marland Kitchen CARISOPRODOL 350 MG PO TABS Oral Take 350 mg by mouth 4 (four) times daily as needed. For muscle relaxant    . DICLOFENAC SODIUM 75 MG PO TBEC Oral Take 1 tablet (75 mg total) by mouth 2 (two) times daily. With food as needed for pain. 20 tablet 0  . GABAPENTIN 800 MG PO TABS  TAKE 1 TABLET BY MOUTH 3 TIMES A DAY 90 tablet 11  . IBUPROFEN 200 MG PO TABS Oral Take 400 mg by mouth 2 (two) times daily as needed. For pain    . LEVOTHYROXINE SODIUM 112 MCG PO TABS  TAKE 1 TABLET BY MOUTH DAILY 30 tablet 1  .  PROMETHAZINE HCL 25 MG PO TABS Oral Take 25 mg by mouth every 6 (six) hours as needed. For nausea    . TRAMADOL HCL 50 MG PO TABS Oral Take 1 tablet (50 mg total) by mouth every 6 (six) hours as needed for pain. 15 tablet 0  . TRIAMCINOLONE ACETONIDE 0.1 % EX CREA Topical Apply 1 application topically 2 (two) times daily. Apply to hands      BP 124/86  Pulse 86  Temp 98.1 F (36.7 C) (Oral)  SpO2 97%  Physical Exam  Nursing note reviewed. Constitutional: She appears well-developed and well-nourished.  Musculoskeletal: She exhibits tenderness.       Hands: Neurological: She is alert.  Skin: There is erythema.    ED Course  Procedures (including critical care time)  Labs Reviewed - No data to display No results found.   1. Infectious tenosynovitis       MDM   Patient presents to urgent care tonight for  her last rabies immunization series. Complaining that her left forefinger has become more swollen and tender. On exam patient demonstrates signs of infectious tenosynovitis. Patient has been on antibiotics (AUGMENTIN) since August 28. With worsening one bite of finger. Patient is failing outpatient antibiotic management specifically to her fifth finger where she sustained a bite, no with restriction of range of motion and exam suggestive of infectious tenosynovitis. I have transfer patient to the emergency department for further evaluation and to be considered for a hand orthopedic consult.    Jimmie Molly, MD 05/31/12 2125

## 2012-05-31 NOTE — ED Notes (Signed)
Pt instructed not to eat or drink .

## 2012-05-31 NOTE — ED Notes (Signed)
Recheck of dog bite, rabies injection 3rd  in series; 5th finger left hand has purulent area, reddened, open draining  wounds right elbow

## 2012-05-31 NOTE — ED Notes (Signed)
Dr Ladon Applebaum advised of apperance of hands, went in to see pt; Called to nurse first , Angelica Chessman , to advise of pending transfer to main ED via shuttle

## 2012-06-01 ENCOUNTER — Emergency Department (HOSPITAL_COMMUNITY): Payer: Medicare Other

## 2012-06-01 ENCOUNTER — Emergency Department (HOSPITAL_COMMUNITY)
Admission: EM | Admit: 2012-06-01 | Discharge: 2012-06-01 | Disposition: A | Discharge status: Home Health | Payer: Medicare Other | Attending: Emergency Medicine | Admitting: Emergency Medicine

## 2012-06-01 DIAGNOSIS — Z8489 Family history of other specified conditions: Secondary | ICD-10-CM | POA: Diagnosis not present

## 2012-06-01 DIAGNOSIS — K219 Gastro-esophageal reflux disease without esophagitis: Secondary | ICD-10-CM | POA: Diagnosis not present

## 2012-06-01 DIAGNOSIS — L089 Local infection of the skin and subcutaneous tissue, unspecified: Secondary | ICD-10-CM

## 2012-06-01 DIAGNOSIS — Z8249 Family history of ischemic heart disease and other diseases of the circulatory system: Secondary | ICD-10-CM | POA: Diagnosis not present

## 2012-06-01 DIAGNOSIS — M7989 Other specified soft tissue disorders: Secondary | ICD-10-CM | POA: Diagnosis not present

## 2012-06-01 DIAGNOSIS — Z800 Family history of malignant neoplasm of digestive organs: Secondary | ICD-10-CM | POA: Diagnosis not present

## 2012-06-01 DIAGNOSIS — Z8261 Family history of arthritis: Secondary | ICD-10-CM | POA: Diagnosis not present

## 2012-06-01 DIAGNOSIS — S61209A Unspecified open wound of unspecified finger without damage to nail, initial encounter: Secondary | ICD-10-CM | POA: Diagnosis not present

## 2012-06-01 DIAGNOSIS — Z888 Allergy status to other drugs, medicaments and biological substances status: Secondary | ICD-10-CM | POA: Diagnosis not present

## 2012-06-01 DIAGNOSIS — Z87891 Personal history of nicotine dependence: Secondary | ICD-10-CM | POA: Diagnosis not present

## 2012-06-01 LAB — POCT I-STAT, CHEM 8
BUN: 4 mg/dL — ABNORMAL LOW (ref 6–23)
Calcium, Ion: 1.15 mmol/L (ref 1.12–1.23)
Chloride: 101 mEq/L (ref 96–112)
Creatinine, Ser: 0.7 mg/dL (ref 0.50–1.10)
Glucose, Bld: 85 mg/dL (ref 70–99)
TCO2: 26 mmol/L (ref 0–100)

## 2012-06-01 LAB — CBC
HCT: 36.5 % (ref 36.0–46.0)
Hemoglobin: 12.5 g/dL (ref 12.0–15.0)
MCH: 28.6 pg (ref 26.0–34.0)
MCV: 83.5 fL (ref 78.0–100.0)
RBC: 4.37 MIL/uL (ref 3.87–5.11)
WBC: 6.6 10*3/uL (ref 4.0–10.5)

## 2012-06-01 MED ORDER — HYDROMORPHONE HCL PF 1 MG/ML IJ SOLN
1.0000 mg | Freq: Once | INTRAMUSCULAR | Status: AC
  Administered 2012-06-01: 1 mg via INTRAVENOUS
  Filled 2012-06-01: qty 1

## 2012-06-01 MED ORDER — ONDANSETRON HCL 4 MG/2ML IJ SOLN
4.0000 mg | Freq: Once | INTRAMUSCULAR | Status: AC
  Administered 2012-06-01: 4 mg via INTRAVENOUS
  Filled 2012-06-01: qty 2

## 2012-06-01 MED ORDER — FLUCONAZOLE 100 MG PO TABS
150.0000 mg | ORAL_TABLET | Freq: Once | ORAL | Status: AC
  Administered 2012-06-01: 150 mg via ORAL
  Filled 2012-06-01: qty 2

## 2012-06-01 MED ORDER — SODIUM CHLORIDE 0.9 % IV SOLN
3.0000 g | Freq: Once | INTRAVENOUS | Status: AC
  Administered 2012-06-01: 3 g via INTRAVENOUS
  Filled 2012-06-01: qty 3

## 2012-06-01 MED ORDER — HYDROCODONE-ACETAMINOPHEN 7.5-500 MG/15ML PO SOLN: 15.0000 mL | Freq: Four times a day (QID) | ORAL | Status: AC | PRN

## 2012-06-01 NOTE — ED Provider Notes (Signed)
History     CSN: [id number]  Arrival date & time 05/31/12  2138   First MD Initiated Contact with Patient 06/01/12 0134      Chief Complaint  Patient presents with  . Finger Injury    (Consider location/radiation/quality/duration/timing/severity/associated sxs/prior treatment) HPI Hx per PT. Dog bite L 5th digit 05-24-12, getting rabies vaccinations initiated for unable to identify or find dog. Taking Augmentin. No fevers. Some wound drainage of distal IP joint. Finger is still painful. Redness and swelling getting worse. Mod in severity, sharp in severity. Evaluated at urgent care tonight and sent here for Hand eval.  Past Medical History  Diagnosis Date  . Esophageal reflux disease 10/1999    EGD showed esophagitis  . Fracture of metatarsal bone of left foot 08/2003    3&4  . 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 Biopsy endometrial 07/1991  . Biopsy endometrial 06/2004    secretory  . Perineal hidradenitis excision 12/2002    incision and drainage  . Colonoscopy 623/2006    normal    Family History  Problem Relation Age of Onset  . Cancer Mother     colon  . Heart attack Father   . OCD Father   . Osteoarthritis Maternal Grandmother     History  Substance Use Topics  . Smoking status: Former Smoker    Quit date: 12/15/2010  . Smokeless tobacco: Never Used  . Alcohol Use: No    OB History    Grav Para Term Preterm Abortions TAB SAB Ect Mult Living                  Review of Systems  Constitutional: Negative for fever and chills.  HENT: Negative for neck pain and neck stiffness.   Eyes: Negative for pain.  Respiratory: Negative for shortness of breath.   Cardiovascular: Negative for chest pain.  Gastrointestinal: Negative for abdominal pain.  Genitourinary: Negative for dysuria.  Musculoskeletal: Negative for back pain.  Skin: Positive for wound. Negative for rash.  Neurological:  Negative for headaches.  All other systems reviewed and are negative.    Allergies  Paroxetine and Tramadol  Home Medications   Current Outpatient Rx  Name Route Sig Dispense Refill  . ALPRAZOLAM 2 MG PO TABS Oral Take 2 mg by mouth 3 (three) times daily as needed. For anxiety    . AMOXICILLIN-POT CLAVULANATE 500-125 MG PO TABS Oral Take 1 tablet (500 mg total) by mouth every 8 (eight) hours. 21 tablet 0  . CARISOPRODOL 350 MG PO TABS Oral Take 350 mg by mouth 4 (four) times daily as needed. For muscle relaxant    . CLOMIPRAMINE HCL 50 MG PO CAPS Oral Take 100-150 mg by mouth 2 (two) times daily. 2 capsules in the morning and 3 capsules at bedtime    . DICLOFENAC SODIUM 75 MG PO TBEC Oral Take 1 tablet (75 mg total) by mouth 2 (two) times daily. With food as needed for pain. 20 tablet 0  . GABAPENTIN 800 MG PO TABS Oral Take 800 mg by mouth 3 (three) times daily.    Marland Kitchen LEVOTHYROXINE SODIUM 112 MCG PO TABS  TAKE 1 TABLET BY MOUTH DAILY 30 tablet 1  . TRIAMCINOLONE ACETONIDE 0.1 % EX CREA Topical Apply 1 application topically 2 (two) times daily. Apply to hands      BP 130/75  Pulse 78  Temp 98.1  F (36.7 C) (Oral)  Resp 18  SpO2 100%  Physical Exam  Constitutional: She is oriented to person, place, and time. She appears well-developed and well-nourished.  HENT:  Head: Normocephalic and atraumatic.  Eyes: Conjunctivae and EOM are normal. Pupils are equal, round, and reactive to light.  Neck: Trachea normal. Neck supple. No thyromegaly present.  Cardiovascular: Normal rate, regular rhythm, S1 normal, S2 normal and normal pulses.     No systolic murmur is present   No diastolic murmur is present  Pulses:      Radial pulses are 2+ on the right side, and 2+ on the left side.  Pulmonary/Chest: Effort normal and breath sounds normal. She has no wheezes. She has no rhonchi. She has no rales. She exhibits no tenderness.  Abdominal: Soft. Normal appearance and bowel sounds are normal.  There is no tenderness. There is no CVA tenderness and negative Murphy's sign.  Musculoskeletal:       L 5th digit with wound over distal IP joint, underlying fluctuance and purulence. TTP, surrounding erythema, no inc warmth to touch  Neurological: She is alert and oriented to person, place, and time. She has normal strength. No cranial nerve deficit or sensory deficit. GCS eye subscore is 4. GCS verbal subscore is 5. GCS motor subscore is 6.  Skin: Skin is warm and dry. No rash noted. She is not diaphoretic.  Psychiatric: Her speech is normal.       Cooperative and appropriate    ED Course  Procedures (including critical care time)  Results for orders placed during the hospital encounter of 06/01/12  CBC      Component Value Range   WBC 6.6  4.0 - 10.5 K/uL   RBC 4.37  3.87 - 5.11 MIL/uL   Hemoglobin 12.5  12.0 - 15.0 g/dL   HCT 16.1  09.6 - 04.5 %   MCV 83.5  78.0 - 100.0 fL   MCH 28.6  26.0 - 34.0 pg   MCHC 34.2  30.0 - 36.0 g/dL   RDW 40.9  81.1 - 91.4 %   Platelets 296  150 - 400 K/uL  POCT I-STAT, CHEM 8      Component Value Range   Sodium 139  135 - 145 mEq/L   Potassium 4.1  3.5 - 5.1 mEq/L   Chloride 101  96 - 112 mEq/L   BUN 4 (*) 6 - 23 mg/dL   Creatinine, Ser 7.82  0.50 - 1.10 mg/dL   Glucose, Bld 85  70 - 99 mg/dL   Calcium, Ion 9.56  2.13 - 1.23 mmol/L   TCO2 26  0 - 100 mmol/L   Hemoglobin 13.6  12.0 - 15.0 g/dL   HCT 08.6  57.8 - 46.9 %    Dg Finger Little Left  06/01/2012  *RADIOLOGY REPORT*  Clinical Data: Dog bite  LEFT LITTLE FINGER 2+V  Comparison: Left hand radiographs 05/25/2012  Findings: Soft tissue swelling. Mild joint space narrowing at DIP joint. No acute fracture, dislocation or bone destruction. Subtle radiopacity identified at dorsal soft tissues of the little finger at the level of the DIP joint, question artifact, unable to exclude subtle radiopaque foreign body.  IMPRESSION: No acute osseous findings. Question artifact versus subtle radiopaque  foreign body at dorsal soft tissues at the level of the DIP joint.   Original Report Authenticated By: Lollie Marrow, M.D.     3:41 AM d/w Hand on call, Dr Izora Ribas - plan follow up in am clinic  for further evaluation, cont Augmentin.  Stable for d.c home and recheck in clinic today.  Had rabies vac at urgent care PTA.  MDM    VS and nursing notes reviewed. Xray and labs as above. Imaging reviewed. HAnd c/s. IV dilaudid pain improved.        Sunnie Nielsen, MD 06/01/12 [phone]

## 2012-06-01 NOTE — ED Notes (Signed)
Pt discharged home in good condition with family 

## 2012-06-06 ENCOUNTER — Telehealth (HOSPITAL_COMMUNITY): Payer: Self-pay | Admitting: *Deleted

## 2012-06-06 DIAGNOSIS — S61209A Unspecified open wound of unspecified finger without damage to nail, initial encounter: Secondary | ICD-10-CM | POA: Diagnosis not present

## 2012-06-06 NOTE — ED Notes (Signed)
9/9 Pt. called on VM and asked if her rabies shot was on 9/11?  9/10  I called her back and left a message saying her appt. was 9/11 @ 1130 and to call back if she had any questions. Vassie Moselle 06/06/2012

## 2012-06-07 ENCOUNTER — Emergency Department (HOSPITAL_COMMUNITY)
Admission: EM | Admit: 2012-06-07 | Discharge: 2012-06-07 | Disposition: A | Discharge status: Home / Self Care | Payer: Medicare Other | Source: Home / Self Care

## 2012-06-07 ENCOUNTER — Encounter (HOSPITAL_COMMUNITY): Payer: Self-pay | Admitting: *Deleted

## 2012-06-07 ENCOUNTER — Emergency Department (INDEPENDENT_AMBULATORY_CARE_PROVIDER_SITE_OTHER): Payer: Medicare Other

## 2012-06-07 DIAGNOSIS — S5001XA Contusion of right elbow, initial encounter: Secondary | ICD-10-CM

## 2012-06-07 DIAGNOSIS — S5000XA Contusion of unspecified elbow, initial encounter: Secondary | ICD-10-CM | POA: Diagnosis not present

## 2012-06-07 DIAGNOSIS — M25529 Pain in unspecified elbow: Secondary | ICD-10-CM | POA: Diagnosis not present

## 2012-06-07 HISTORY — DX: Unspecified convulsions: R56.9

## 2012-06-07 MED ORDER — RABIES VACCINE, PCEC IM SUSR
INTRAMUSCULAR | Status: AC
  Filled 2012-06-07: qty 1

## 2012-06-07 MED ORDER — RABIES VACCINE, PCEC IM SUSR
1.0000 mL | Freq: Once | INTRAMUSCULAR | Status: AC
  Administered 2012-06-07: 1 mL via INTRAMUSCULAR

## 2012-06-07 NOTE — ED Notes (Signed)
L small finger redressed with sterile 2x2 and 1" cling applied. Pt.instructed to keep wearing her splint that the doctor gave her and why it was important for her to wear it.

## 2012-06-07 NOTE — ED Notes (Signed)
Pt. here for last rabies vaccine.  Pt. c/o pain and swelling to R elbow, distal to healing wound.  I told pt. it looks like she bled under the skin and the body has to reabsorb it.  She thinks she needs an xray. Pt. has good ROM to elbow.

## 2012-06-07 NOTE — ED Provider Notes (Signed)
History     CSN: [id number]  Arrival date & time 06/07/12  1645   None     Chief Complaint  Patient presents with  . Rabies Injection    (Consider location/radiation/quality/duration/timing/severity/associated sxs/prior treatment) HPI Comments: Pt here for final rabies shot,  Pt wants right elbow evaluated.  Pt reports arm was jerked.  Pt concerned because elbow has not been xrayed.  Pt complains of continued elbow swelling.  Pt is on doxycycline for finger and is being followed by Dr. Izora Ribas.   The history is provided by the patient. No language interpreter was used.    Past Medical History  Diagnosis Date  . Esophageal reflux disease 10/1999    EGD showed esophagitis  . Fracture of metatarsal bone of left foot 08/2003    3&4  . 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 Seizures     Past Surgical History  Procedure Date  . Biopsy endometrial 07/1991  . Biopsy endometrial 06/2004    secretory  . Perineal hidradenitis excision 12/2002    incision and drainage  . Colonoscopy 623/2006    normal  . Wrist surgery     Right    Family History  Problem Relation Age of Onset  . Cancer Mother     colon  . Heart attack Father   . OCD Father   . Osteoarthritis Maternal Grandmother     History  Substance Use Topics  . Smoking status: Former Smoker    Quit date: 12/15/2010  . Smokeless tobacco: Never Used  . Alcohol Use: No    OB History    Grav Para Term Preterm Abortions TAB SAB Ect Mult Living                  Review of Systems  Musculoskeletal: Positive for joint swelling.  All other systems reviewed and are negative.    Allergies  Caffeine; Paroxetine; and Tramadol  Home Medications   Current Outpatient Rx  Name Route Sig Dispense Refill  . ALPRAZOLAM 2 MG PO TABS Oral Take 2 mg by mouth 3 (three) times daily as needed. For anxiety    . CARISOPRODOL 350 MG PO TABS Oral Take 350 mg by mouth 4 (four) times daily as needed.  For muscle relaxant    . CLOMIPRAMINE HCL 50 MG PO CAPS Oral Take 100-150 mg by mouth 2 (two) times daily. 2 capsules in the morning and 3 capsules at bedtime    . DOXYCYCLINE HYCLATE 100 MG PO TABS Oral Take 100 mg by mouth 2 (two) times daily.    Marland Kitchen GABAPENTIN 800 MG PO TABS Oral Take 800 mg by mouth 3 (three) times daily.    Marland Kitchen HYDROCODONE-ACETAMINOPHEN 7.5-500 MG/15ML PO SOLN Oral Take 15 mLs by mouth every 6 (six) hours as needed for pain. 60 mL 0  . LEVOTHYROXINE SODIUM 112 MCG PO TABS  TAKE 1 TABLET BY MOUTH DAILY 30 tablet 1  . DICLOFENAC SODIUM 75 MG PO TBEC Oral Take 1 tablet (75 mg total) by mouth 2 (two) times daily. With food as needed for pain. 20 tablet 0  . TRIAMCINOLONE ACETONIDE 0.1 % EX CREA Topical Apply 1 application topically 2 (two) times daily. Apply to hands      BP 130/77  Pulse 95  Temp 98.6 F (37 C) (Oral)  Resp 20  SpO2 98%  Physical Exam  Vitals reviewed. Constitutional: She appears well-developed and well-nourished.  Musculoskeletal: She exhibits  tenderness.       Swollen right elbow,  Bruising is clearing,  Right fith finger erythema,  Decreased range of motion  Skin: Skin is warm and dry.  Psychiatric: She has a normal mood and affect.    ED Course  Procedures (including critical care time)  Labs Reviewed - No data to display Dg Elbow Complete Right  06/07/2012  *RADIOLOGY REPORT*  Clinical Data: Elbow pain  RIGHT ELBOW - COMPLETE 3+ VIEW  Comparison: None.  Findings: Four views of the right elbow submitted.  No acute fracture or subluxation. No posterior fat pad sign.  No radiopaque foreign body.  IMPRESSION: No acute fracture or subluxation.  No posterior fat pad sign.   Original Report Authenticated By: Natasha Mead, M.D.      No diagnosis found.    MDM  Follow up with Dr. Izora Ribas,  Continue current antibiotics        Lonia Skinner [place], [place] 06/07/12 1818

## 2012-06-09 ENCOUNTER — Other Ambulatory Visit: Payer: Self-pay | Admitting: Family Medicine

## 2012-06-12 NOTE — ED Provider Notes (Signed)
Dx contusion R elbow  Medical screening examination/treatment/procedure(s) were performed by non-physician practitioner and as supervising physician I was immediately available for consultation/collaboration.  Luiz Blare MD   Luiz Blare, MD 06/12/12 [phone]

## 2012-06-15 DIAGNOSIS — S61209A Unspecified open wound of unspecified finger without damage to nail, initial encounter: Secondary | ICD-10-CM | POA: Diagnosis not present

## 2012-07-04 ENCOUNTER — Other Ambulatory Visit: Payer: Self-pay | Admitting: Family Medicine

## 2012-07-07 ENCOUNTER — Telehealth: Payer: Self-pay | Admitting: Family Medicine

## 2012-07-07 ENCOUNTER — Other Ambulatory Visit: Payer: Self-pay | Admitting: Family Medicine

## 2012-07-07 NOTE — Telephone Encounter (Signed)
Called pt. She has upcoming appt with Dr.Hale 10/31, has enough Anafranil for the weekend, but needs it refilled before her appt. I told the pt, that I would ask Dr. Sheffield Slider.  [name], [name]

## 2012-07-07 NOTE — Telephone Encounter (Signed)
Patient is asking if enough of her medications can be sent in since she has scheduled an appt on 10/31.  She uses [facility] on [address].

## 2012-07-10 ENCOUNTER — Telehealth: Payer: Self-pay | Admitting: Family Medicine

## 2012-07-10 MED ORDER — CLOMIPRAMINE HCL 50 MG PO CAPS: 100.0000 mg | ORAL_CAPSULE | Freq: Two times a day (BID) | ORAL | Status: DC

## 2012-07-10 MED ORDER — ALPRAZOLAM 2 MG PO TABS: 2.0000 mg | ORAL_TABLET | Freq: Three times a day (TID) | ORAL | Status: DC | PRN

## 2012-07-10 NOTE — Telephone Encounter (Signed)
Will refill both for 1 month.  Rx given to [facility].

## 2012-07-10 NOTE — Telephone Encounter (Signed)
Pt is out of her ANAFRANIL 50 MG capsule   has an appt on 10/31 and needs enough until then   [facility] [address]

## 2012-07-10 NOTE — Telephone Encounter (Signed)
Pt is also out of her xanax and needs it asap  Needs to speak to nurse

## 2012-07-10 NOTE — Telephone Encounter (Signed)
Will forward message to Dr. Earnest Bailey in Dr. Martin Majestic absence

## 2012-07-10 NOTE — Telephone Encounter (Signed)
Rx  For xanax faxed to pharmacy.. Patient notified that both RX have been sent.

## 2012-07-12 NOTE — Telephone Encounter (Signed)
Pt had to take more of the xanax than normal because of a dog accident and she had to go to hospital - pharmacy states they can't give her the xanax until the 24th unless we give the overide to refill early

## 2012-07-12 NOTE — Telephone Encounter (Signed)
Ok to fill early.  Make sure patient makes follow-up with Dr. Sheffield Slider before another fill is needed.

## 2012-07-12 NOTE — Telephone Encounter (Signed)
Will check with Dr. Earnest Bailey to see if OK for pharmacy to dispense med today instead of 24th.  Gaylene Brooks, RN

## 2012-07-12 NOTE — Telephone Encounter (Addendum)
Spoke with Neysa Bonito at [facility].  Informed Ok to refill Xanax today.  Pharmacy will inform patient that she will have to pay out of pocket for med because insurance will not cover early refill of med.  Pharmacy states patient has been getting Xanax 2 mg---take 1/2 tab TID per Dr. Donell Beers.  Ok to have patient continue meds per Dr. Caprice Renshaw directions.  Pharmacy also states patient last got Xanax #30 day supply from Dr. Donell Beers on 06/19/12.  Dog accident was on 05/25/12 and patient should not continue to need to use extra med per pharmacist.  Dr. Donell Beers had refilled med yesterday, but canceled refill when he realized that pharmacy also contacted our office for refill.  Patient has an appt with Dr. Donell Beers for 07/2012.  Patient requesting brand name and costs $171 versus generic at $21.29.  Pharmacy explained that patient's insurance will not cover med and wants [place] to contact patient to explain same.  Returned call to patient and left message to call our office back.  Gaylene Brooks, RN

## 2012-07-13 NOTE — Telephone Encounter (Signed)
Patient informed of info from pharmacy.  Patient unable to afford brand name and will consider generic.  Patient will pick med up from pharmacy and follow up with Dr. Mickeal Skinner 07/27/12.  Gaylene Brooks, RN

## 2012-07-17 NOTE — Telephone Encounter (Signed)
She hasn't been to see me for over a year. I was trying to have her get her psychiatric medications from Dr Waverly Ferrari. There is no rational reason why she needs brand name medications. I feel that our practice has been overly responsive in giving her refills since she hasn't followed up with [place] for so long.

## 2012-07-17 NOTE — Telephone Encounter (Signed)
Pt is calling back to see if we can do an override for her xanax.  She has tried the generic before and doesn't work.  Wants to know if someone will do the override so she can get her xanax today.

## 2012-07-17 NOTE — Telephone Encounter (Signed)
Patient notified

## 2012-07-17 NOTE — Telephone Encounter (Signed)
Called pharmacy to get insurance #  # [id number]. ID # [id number].   Patient wants brand name Xanax. Will ask Dr. Sheffield Slider if he wants to do PA before I call for the form. . Pharmacist states patient frequently asks to get medication early.. She is due to get refill on 10/23 however this is for generic.  Will ask Dr. Sheffield Slider to advise.

## 2012-07-26 ENCOUNTER — Other Ambulatory Visit: Payer: Self-pay | Admitting: Family Medicine

## 2012-07-26 DIAGNOSIS — F0630 Mood disorder due to known physiological condition, unspecified: Secondary | ICD-10-CM | POA: Diagnosis not present

## 2012-07-27 ENCOUNTER — Ambulatory Visit: Payer: Medicare Other

## 2012-08-03 ENCOUNTER — Other Ambulatory Visit: Payer: Self-pay | Admitting: Family Medicine

## 2012-08-08 ENCOUNTER — Encounter: Payer: Self-pay | Admitting: Family Medicine

## 2012-08-08 ENCOUNTER — Ambulatory Visit (INDEPENDENT_AMBULATORY_CARE_PROVIDER_SITE_OTHER): Payer: Medicare Other | Admitting: Family Medicine

## 2012-08-08 VITALS — BP 150/97 | HR 93 | Temp 98.5°F | Ht 61.5 in | Wt 172.0 lb

## 2012-08-08 DIAGNOSIS — F429 Obsessive-compulsive disorder, unspecified: Secondary | ICD-10-CM | POA: Diagnosis not present

## 2012-08-08 DIAGNOSIS — Z23 Encounter for immunization: Secondary | ICD-10-CM | POA: Diagnosis not present

## 2012-08-08 DIAGNOSIS — IMO0001 Reserved for inherently not codable concepts without codable children: Secondary | ICD-10-CM | POA: Diagnosis not present

## 2012-08-08 DIAGNOSIS — F411 Generalized anxiety disorder: Secondary | ICD-10-CM | POA: Diagnosis not present

## 2012-08-08 DIAGNOSIS — E669 Obesity, unspecified: Secondary | ICD-10-CM

## 2012-08-08 DIAGNOSIS — E039 Hypothyroidism, unspecified: Secondary | ICD-10-CM

## 2012-08-08 DIAGNOSIS — F419 Anxiety disorder, unspecified: Secondary | ICD-10-CM

## 2012-08-08 MED ORDER — CLOMIPRAMINE HCL 50 MG PO CAPS: 100.0000 mg | ORAL_CAPSULE | Freq: Two times a day (BID) | ORAL | Status: DC

## 2012-08-08 MED ORDER — ALPRAZOLAM 2 MG PO TABS: 2.0000 mg | ORAL_TABLET | Freq: Three times a day (TID) | ORAL | Status: DC | PRN

## 2012-08-08 NOTE — Patient Instructions (Addendum)
Please return to see Dr Sheffield Slider in 1 month  I will let you know the results of your lab tests

## 2012-08-08 NOTE — Assessment & Plan Note (Signed)
I strongly advised her to follow up with a psychiatrist since she is having more vegetative symptoms. I won't do authorizations for "brand name only" since I don't believe that it is necessary

## 2012-08-08 NOTE — Assessment & Plan Note (Signed)
She has gained more weight

## 2012-08-08 NOTE — Assessment & Plan Note (Signed)
Check TSH 

## 2012-08-08 NOTE — Progress Notes (Signed)
  Subjective:    Patient ID: Jenna Burke, female    DOB: 12/30/1960, 51 y.o.   MRN: [id number]  HPI Psychiatric - Says Dr Raquel James and she doesn't get along with her replacement, Dr Donell Beers. Says their office did the preauthorization for her to get brand name Anafranil. Says she's been staying at home. Not bathing  Menopause - no recent menses. Has hot flashes  Fibromyalgia - No longer taking Gabapentin. Dr Raquel James prescribed Tresa Garter.   FH colon CA - hasn't seen blood in her bowel movement   Has fasted all day and would like her glucose and thyroid checked. Review of Systems     Objective:   Physical Exam  Constitutional: She is oriented to person, place, and time.       Generalized obesity   Cardiovascular: Normal rate and regular rhythm.   Pulmonary/Chest: Effort normal and breath sounds normal. She has no wheezes. She has no rales.  Abdominal: Soft. She exhibits no distension and no mass. There is no tenderness. There is no guarding.  Neurological: She is alert and oriented to person, place, and time.  Skin:       Healed puncture wounds on left hand.  Psychiatric:       Behavior as before. Fixates on topics.          Assessment & Plan:

## 2012-08-09 ENCOUNTER — Telehealth: Payer: Self-pay | Admitting: *Deleted

## 2012-08-09 ENCOUNTER — Telehealth: Payer: Self-pay | Admitting: Family Medicine

## 2012-08-09 ENCOUNTER — Other Ambulatory Visit: Payer: Self-pay | Admitting: Family Medicine

## 2012-08-09 ENCOUNTER — Encounter: Payer: Self-pay | Admitting: Family Medicine

## 2012-08-09 LAB — COMPREHENSIVE METABOLIC PANEL
Albumin: 4.2 g/dL (ref 3.5–5.2)
CO2: 26 mEq/L (ref 19–32)
Glucose, Bld: 83 mg/dL (ref 70–99)
Potassium: 4.2 mEq/L (ref 3.5–5.3)
Sodium: 140 mEq/L (ref 135–145)
Total Protein: 6.9 g/dL (ref 6.0–8.3)

## 2012-08-09 NOTE — Telephone Encounter (Signed)
RX has been transferred to [facility] [address]. Patient notified.

## 2012-08-09 NOTE — Telephone Encounter (Signed)
Pharmacy calling requesting RX for Anafranil 50 mg be sent in for Brand Name Only.  Keni normal gets Brand Name and for insurance to cover the prescription must state Brand Name Only.  Will forward to Dr. Sheffield Slider to resend prescription.  Ileana Ladd

## 2012-08-09 NOTE — Telephone Encounter (Signed)
Sates that pharmacy did not rec script for her anafranil - needs it called to [facility]

## 2012-08-10 NOTE — Telephone Encounter (Signed)
Rx faxed to [facility].  Ileana Ladd

## 2012-08-10 NOTE — Telephone Encounter (Signed)
Rx printed and put on D. Loring's desk to fax to pharmacy

## 2012-09-08 ENCOUNTER — Other Ambulatory Visit: Payer: Self-pay | Admitting: Family Medicine

## 2012-09-12 ENCOUNTER — Ambulatory Visit: Payer: Medicare Other | Admitting: Family Medicine

## 2012-09-12 ENCOUNTER — Telehealth: Payer: Self-pay | Admitting: Family Medicine

## 2012-09-12 NOTE — Telephone Encounter (Signed)
I returned a fax request for Ambien from [facility] with a note that I haven't prescribed this medication for this patient.

## 2012-10-03 ENCOUNTER — Ambulatory Visit: Payer: Medicare Other | Admitting: Family Medicine

## 2012-10-11 ENCOUNTER — Other Ambulatory Visit: Payer: Self-pay | Admitting: Family Medicine

## 2012-10-11 NOTE — Telephone Encounter (Signed)
Patient used to get carisoprodol from a doctor that has now retired and her pharmacy is asking that Dr. Sheffield Slider call in the [facility] [address]  She doesn't have many left.  Patient said that if she isn't at home, it is ok to leave a message.

## 2012-10-11 NOTE — Telephone Encounter (Signed)
LMOM advising pt of Dr Leonie Green recommendations.

## 2012-10-11 NOTE — Telephone Encounter (Signed)
Chart review reveals Dr. Sheffield Slider has not prescribed this before.  Will not be able to fill controlled substances for a new prescription outside of office visit. Please encouraged patient to keep appointment on 10/24/12 to discuss this medication with PCP in further detail.

## 2012-10-24 ENCOUNTER — Ambulatory Visit: Payer: Medicare Other | Admitting: Family Medicine

## 2012-10-26 ENCOUNTER — Encounter: Payer: Self-pay | Admitting: Family Medicine

## 2012-10-26 ENCOUNTER — Ambulatory Visit (INDEPENDENT_AMBULATORY_CARE_PROVIDER_SITE_OTHER): Payer: Medicare Other | Admitting: Family Medicine

## 2012-10-26 VITALS — BP 126/85 | HR 94 | Ht 61.5 in | Wt 186.0 lb

## 2012-10-26 DIAGNOSIS — IMO0001 Reserved for inherently not codable concepts without codable children: Secondary | ICD-10-CM | POA: Diagnosis not present

## 2012-10-26 DIAGNOSIS — F429 Obsessive-compulsive disorder, unspecified: Secondary | ICD-10-CM

## 2012-10-26 DIAGNOSIS — E669 Obesity, unspecified: Secondary | ICD-10-CM | POA: Diagnosis not present

## 2012-10-26 DIAGNOSIS — S91209A Unspecified open wound of unspecified toe(s) with damage to nail, initial encounter: Secondary | ICD-10-CM

## 2012-10-26 DIAGNOSIS — F411 Generalized anxiety disorder: Secondary | ICD-10-CM | POA: Diagnosis not present

## 2012-10-26 DIAGNOSIS — S91109A Unspecified open wound of unspecified toe(s) without damage to nail, initial encounter: Secondary | ICD-10-CM | POA: Diagnosis not present

## 2012-10-26 DIAGNOSIS — M20019 Mallet finger of unspecified finger(s): Secondary | ICD-10-CM

## 2012-10-26 DIAGNOSIS — F419 Anxiety disorder, unspecified: Secondary | ICD-10-CM

## 2012-10-26 DIAGNOSIS — N951 Menopausal and female climacteric states: Secondary | ICD-10-CM | POA: Diagnosis not present

## 2012-10-26 DIAGNOSIS — M20012 Mallet finger of left finger(s): Secondary | ICD-10-CM

## 2012-10-26 MED ORDER — ALPRAZOLAM 2 MG PO TABS: 2.0000 mg | ORAL_TABLET | Freq: Three times a day (TID) | ORAL | Status: DC | PRN

## 2012-10-26 NOTE — Assessment & Plan Note (Signed)
She continues to gain weight. Will readdress next visit. Recent fasting glucose normal.

## 2012-10-26 NOTE — Patient Instructions (Addendum)
Please return to see Dr Sheffield Slider in 3 months for pap smear  Please come one to two weeks before that for your fasting labs.   Apply Neosporine to the left big toenail where it drained. Return for recheck if the toe redness and soreness worsens. Apply bag balm twice daily after soaking your feet.

## 2012-10-26 NOTE — Assessment & Plan Note (Signed)
Continue Soma for now, Didn't tolerate Gabapentin

## 2012-10-26 NOTE — Assessment & Plan Note (Signed)
I reluctantly refilled her Alprazolam and added "brand name medically necessary"  I declined to refill Ambien. I recommended [facility].

## 2012-10-26 NOTE — Assessment & Plan Note (Signed)
May have been infected, but improved on self drainage. Will have her apply topical antibiotic and monitor for recurrence of infection.

## 2012-10-26 NOTE — Assessment & Plan Note (Signed)
No remedy for this now

## 2012-10-26 NOTE — Progress Notes (Signed)
  Subjective:    Patient ID: RYELYNN GUEDEA, female    DOB: 1961-05-28, 52 y.o.   MRN: [id number]  HPI Psychiatric - She was fired from Dr [employer] office and they sent alternative providers including [facility]. She requests Alprazolam refill with Brand name medically necessary, continuing to say that the generic doesn't help her. She requests Ambien refill, which I declined to do since she's on the Alprazolam. She continues the Anafranil.   URI - several weeks of mild nasal congestion, ear discomfort and intermittent sore throat   Dog bite - last August for which she received rabies immunization left her with scars around her right elbow and a left DIP joint that is painful and won't fully extend.   Fibromyalgia - She requests to continue generic Soma which seems to help her extremity pains. Takes some Advil and no Acetaminophen.   left great toe pain - Partially loosened the medial nail when she slipped and fell a couple weeks ago. Yesterday it was more painful and drained a small amount of pus.  Review of Systems     Objective:   Physical Exam  Constitutional:       Central obesity   HENT:  Right Ear: External ear normal.  Left Ear: External ear normal.  Mouth/Throat: Oropharynx is clear and moist. No oropharyngeal exudate.  Eyes: Conjunctivae normal are normal.  Cardiovascular: Normal rate and regular rhythm.   Pulmonary/Chest: Breath sounds normal.  Musculoskeletal:       She complains with straight leg raising, but full range of motion  left little finger DIP joint can't be fully extended with tender,red enlargement of the joint dorsally where the dog bit her    Neurological: She is alert.  Skin:       Well healed dog bite scars around the right elbow  The medial base of the left great toe is slightly reddened with yellow color over the medical proximal nail bed, but minimal induration and no drainage.   Psychiatric: She has a normal mood and affect. Her behavior is normal.  Judgment and thought content normal.          Assessment & Plan:

## 2012-10-26 NOTE — Telephone Encounter (Signed)
On visit today she has one more refill. I will refill it when needed.

## 2012-10-26 NOTE — Assessment & Plan Note (Signed)
Return for pap due in April

## 2012-10-31 ENCOUNTER — Telehealth: Payer: Self-pay | Admitting: Family Medicine

## 2012-10-31 ENCOUNTER — Other Ambulatory Visit: Payer: Self-pay | Admitting: Family Medicine

## 2012-10-31 MED ORDER — CARISOPRODOL 350 MG PO TABS: 350.0000 mg | ORAL_TABLET | Freq: Four times a day (QID) | ORAL | Status: DC | PRN

## 2012-10-31 NOTE — Telephone Encounter (Signed)
Will fwd. To Dr.Hale. [name], [name]

## 2012-10-31 NOTE — Telephone Encounter (Addendum)
Pt states that she does not have a refill on the carisoprodol (SOMA) 350 MG  [facility]

## 2012-11-02 NOTE — Telephone Encounter (Signed)
Printed to fax to her pharmacy

## 2013-01-01 ENCOUNTER — Other Ambulatory Visit: Payer: Self-pay

## 2013-01-01 DIAGNOSIS — Z1231 Encounter for screening mammogram for malignant neoplasm of breast: Secondary | ICD-10-CM

## 2013-01-16 ENCOUNTER — Encounter: Payer: Self-pay | Admitting: Family Medicine

## 2013-01-16 ENCOUNTER — Other Ambulatory Visit (HOSPITAL_COMMUNITY)
Admission: RE | Admit: 2013-01-16 | Discharge: 2013-01-16 | Disposition: A | Discharge status: Home / Self Care | Payer: Medicare Other | Source: Ambulatory Visit | Attending: Family Medicine | Admitting: Family Medicine

## 2013-01-16 ENCOUNTER — Ambulatory Visit (INDEPENDENT_AMBULATORY_CARE_PROVIDER_SITE_OTHER): Payer: Medicare Other | Admitting: Family Medicine

## 2013-01-16 VITALS — BP 133/83 | HR 92 | Temp 98.2°F | Ht 61.5 in | Wt 180.0 lb

## 2013-01-16 DIAGNOSIS — F429 Obsessive-compulsive disorder, unspecified: Secondary | ICD-10-CM

## 2013-01-16 DIAGNOSIS — F411 Generalized anxiety disorder: Secondary | ICD-10-CM | POA: Diagnosis not present

## 2013-01-16 DIAGNOSIS — Z124 Encounter for screening for malignant neoplasm of cervix: Secondary | ICD-10-CM | POA: Insufficient documentation

## 2013-01-16 DIAGNOSIS — E669 Obesity, unspecified: Secondary | ICD-10-CM

## 2013-01-16 DIAGNOSIS — E039 Hypothyroidism, unspecified: Secondary | ICD-10-CM | POA: Diagnosis not present

## 2013-01-16 DIAGNOSIS — F419 Anxiety disorder, unspecified: Secondary | ICD-10-CM

## 2013-01-16 DIAGNOSIS — J069 Acute upper respiratory infection, unspecified: Secondary | ICD-10-CM | POA: Diagnosis not present

## 2013-01-16 MED ORDER — ALPRAZOLAM 2 MG PO TABS: 2.0000 mg | ORAL_TABLET | Freq: Three times a day (TID) | ORAL | Status: DC | PRN

## 2013-01-16 MED ORDER — PSEUDOEPHEDRINE-CODEINE-GG 30-10-100 MG/5ML PO SOLN: 10.0000 mL | Freq: Four times a day (QID) | ORAL | Status: DC | PRN

## 2013-01-16 NOTE — Assessment & Plan Note (Signed)
No sign of complications, but the cough causes stress incontinence so cough med with codeine and phenyephrine prescribed.

## 2013-01-16 NOTE — Assessment & Plan Note (Signed)
New nail growing beneath

## 2013-01-16 NOTE — Assessment & Plan Note (Signed)
Encouraged her to take her levothyroid daily

## 2013-01-16 NOTE — Assessment & Plan Note (Signed)
stable °

## 2013-01-16 NOTE — Assessment & Plan Note (Signed)
Says she plans to start dieting

## 2013-01-16 NOTE — Patient Instructions (Addendum)
Come in for recheck in 3 months.to meet your new doctor  Recheck sooner if you get sicker from this URI

## 2013-01-16 NOTE — Progress Notes (Signed)
  Subjective:    Patient ID: Jenna Burke, female    DOB: [birth year], 52 y.o.   MRN: [id number]  HPI Sore throat x 3 days, Cough, nasal congestion Delsym vomiting and incontinence. Mild diarrhea. No fever  Pap smear - no recent vaginal bleeding or discharge.   Mammogram - has on scheduled.  Psych - Not currently seeing a psychiatrist. Taking her medications the same.   Thyroid - admits to not always taking it. Girl friend says it makes her hyper.  Review of Systems     Objective:   Physical Exam  Constitutional:  Generalized obesity   HENT:  Right Ear: External ear normal.  Left Ear: External ear normal.  Mouth/Throat: Oropharynx is clear and moist.  Nose congested  Eyes: Conjunctivae are normal. Right eye exhibits no discharge. Left eye exhibits no discharge.  Neck: Normal range of motion. No thyromegaly present.  Cardiovascular: Normal rate and regular rhythm.   No murmur heard. Pulmonary/Chest: Effort normal and breath sounds normal. She has no wheezes. She has no rales.  Abdominal: Soft. She exhibits no mass. There is no tenderness. There is no guarding.  Genitourinary: Vagina normal and uterus normal.  Narrow vaginal canal. Cervix posterior and os couldn't be visualized so pap scraping done blindly  Adnexae not palpable due to obesity  Lymphadenopathy:    She has no cervical adenopathy.  Neurological: She is alert.  Psychiatric: She has a normal mood and affect. Her behavior is normal. Judgment and thought content normal.          Assessment & Plan:

## 2013-01-17 ENCOUNTER — Encounter: Payer: Self-pay | Admitting: Family Medicine

## 2013-01-19 ENCOUNTER — Telehealth: Payer: Self-pay | Admitting: *Deleted

## 2013-01-19 NOTE — Telephone Encounter (Signed)
[facility] on [facility] line.  States Dr. Sheffield Slider prescribed pseudoephedrine-codeine-guaifenesin cough syrup for Jenna Burke and they are having a hard time finding the cough syrup with Sudafed in it.  Asking if it is ok to change to Robitussin AC instead.  Precepted with Dr. Earnest Bailey.  [facility] and [facility].  Verbal okay given to change to Robitussin AC.  Ileana Ladd

## 2013-01-22 ENCOUNTER — Other Ambulatory Visit: Payer: Self-pay | Admitting: Family Medicine

## 2013-01-25 DIAGNOSIS — H04129 Dry eye syndrome of unspecified lacrimal gland: Secondary | ICD-10-CM | POA: Diagnosis not present

## 2013-01-25 DIAGNOSIS — H2510 Age-related nuclear cataract, unspecified eye: Secondary | ICD-10-CM | POA: Diagnosis not present

## 2013-01-25 DIAGNOSIS — H1045 Other chronic allergic conjunctivitis: Secondary | ICD-10-CM | POA: Diagnosis not present

## 2013-02-01 ENCOUNTER — Ambulatory Visit
Admission: RE | Admit: 2013-02-01 | Discharge: 2013-02-01 | Disposition: A | Discharge status: Home / Self Care | Payer: Medicare Other | Source: Ambulatory Visit

## 2013-02-01 DIAGNOSIS — Z1231 Encounter for screening mammogram for malignant neoplasm of breast: Secondary | ICD-10-CM | POA: Diagnosis not present

## 2013-02-05 ENCOUNTER — Other Ambulatory Visit: Payer: Self-pay | Admitting: Family Medicine

## 2013-02-12 ENCOUNTER — Other Ambulatory Visit: Payer: Self-pay | Admitting: Family Medicine

## 2013-02-20 ENCOUNTER — Ambulatory Visit: Payer: Medicare Other | Admitting: Family Medicine

## 2013-03-20 ENCOUNTER — Other Ambulatory Visit: Payer: Self-pay | Admitting: Family Medicine

## 2013-03-20 DIAGNOSIS — J069 Acute upper respiratory infection, unspecified: Secondary | ICD-10-CM

## 2013-03-20 MED ORDER — PSEUDOEPHEDRINE-CODEINE-GG 30-10-100 MG/5ML PO SOLN: 10.0000 mL | Freq: Four times a day (QID) | ORAL | Status: DC | PRN

## 2013-05-01 ENCOUNTER — Other Ambulatory Visit: Payer: Self-pay | Admitting: Family Medicine

## 2013-05-01 ENCOUNTER — Encounter: Payer: Self-pay | Admitting: Family Medicine

## 2013-05-18 ENCOUNTER — Ambulatory Visit: Payer: Medicare Other | Admitting: Family Medicine

## 2013-05-22 ENCOUNTER — Encounter: Payer: Self-pay | Admitting: Family Medicine

## 2013-05-22 ENCOUNTER — Ambulatory Visit (INDEPENDENT_AMBULATORY_CARE_PROVIDER_SITE_OTHER): Payer: Medicare Other | Admitting: Family Medicine

## 2013-05-22 VITALS — BP 126/89 | HR 91 | Ht 61.5 in | Wt 164.0 lb

## 2013-05-22 DIAGNOSIS — E039 Hypothyroidism, unspecified: Secondary | ICD-10-CM | POA: Diagnosis not present

## 2013-05-22 DIAGNOSIS — F410 Panic disorder [episodic paroxysmal anxiety] without agoraphobia: Secondary | ICD-10-CM | POA: Diagnosis not present

## 2013-05-22 DIAGNOSIS — F411 Generalized anxiety disorder: Secondary | ICD-10-CM | POA: Diagnosis not present

## 2013-05-22 DIAGNOSIS — F419 Anxiety disorder, unspecified: Secondary | ICD-10-CM

## 2013-05-22 MED ORDER — CITALOPRAM HYDROBROMIDE 20 MG PO TABS: 10.0000 mg | ORAL_TABLET | Freq: Every day | ORAL | Status: DC

## 2013-05-22 MED ORDER — ALPRAZOLAM 2 MG PO TABS: 2.0000 mg | ORAL_TABLET | Freq: Three times a day (TID) | ORAL | Status: DC | PRN

## 2013-05-22 NOTE — Assessment & Plan Note (Signed)
Patient is due for recheck TSH, she has not been taking Synthroid due to side effect of increased anxiety -Check TSH -Counseled patient on importance of taking synthroid daily

## 2013-05-22 NOTE — Assessment & Plan Note (Signed)
Patient continues to have daily panic attacks and anxiety which are minimally controlled with PRN Xanax -Will start low dose Celexa (counseled on common side effects of this medication) -Patient can continue to take Xanax as needed -Will schedule follow up in one month

## 2013-05-22 NOTE — Patient Instructions (Addendum)
Start Celexa 10 mg daily  Continue to take Xanax as needed  Check TSH, I suspect that we will need to restart the Synthroid.   Return to office in one month.

## 2013-05-22 NOTE — Progress Notes (Signed)
  Subjective:    Patient ID: Jenna Burke, female    DOB: [birth year], 52 y.o.   MRN: [id number]  HPI 52 year old female presents for follow up of multiple medical conditions.  Hypothyroidism - has not been taking Synthroid for at least the past 6 months, she states that she had increased anxiety while taking the medication, has had increased fatigue lately, has dry skin, no hair changes, no constipation, has hot flashes however denies hot/cold intolerance  Anxiety-takes PRN xanax which provides some relief of symptoms, has daily panic attacks, her mood is currently down as her sister recently passed away, no anhedonia, has difficulty sleeping at night, has decreased concentration which is chronic, no SI, no HI   Review of Systems  Constitutional: Positive for fatigue. Negative for fever and chills.  Respiratory: Negative for cough.   Cardiovascular: Negative for chest pain.  Endocrine: Negative for cold intolerance and heat intolerance.  Psychiatric/Behavioral: Negative for suicidal ideas and self-injury.       Objective:   Physical Exam Vitals: Reviewed Gen: pleasant caucasian female, accompanied by sister, has slowed mentation HEENT: normocephalic, PERRL, EOMI, MMM, no thyromegaly, neck was supple Cardiac: RRR, S1 and S2 were present, no murmurs, no heaves or thrills RESP: normal effort, clear to auscultation bilaterally Psych: appropriately dressed, affect was somewhat flat, normal thought process, no SI, no HI      Assessment & Plan:  Please see problem specific assessment and plan.

## 2013-05-23 ENCOUNTER — Other Ambulatory Visit: Payer: Self-pay | Admitting: Family Medicine

## 2013-05-23 ENCOUNTER — Encounter: Payer: Self-pay | Admitting: Family Medicine

## 2013-05-29 ENCOUNTER — Telehealth: Payer: Self-pay | Admitting: Family Medicine

## 2013-05-29 NOTE — Telephone Encounter (Signed)
Patient calling inquiring about her lab results from last week 8/26. Pls call patient w/ results.

## 2013-05-29 NOTE — Telephone Encounter (Signed)
Please advise and I will be happy to call if you would like Wyatt Haste, RN-BSN

## 2013-05-30 NOTE — Telephone Encounter (Signed)
Discussed recent TSH with patient. As this was normal and she has not been taking Synthroid for greater than 6 months I advised her to continue to not take the synthroid. Will need to repeat in 6 months.

## 2013-06-22 ENCOUNTER — Telehealth: Payer: Self-pay | Admitting: Family Medicine

## 2013-06-22 NOTE — Telephone Encounter (Signed)
Pt is calling for a referral to Dr. Terri Piedra [facility]. She has an appointment on Monday.JW

## 2013-06-22 NOTE — Telephone Encounter (Signed)
Called pt. LMVM for pt to call back. (pt needs to keep appt with Dr.Fletke on Monday and will be evaluated for possible referral) .Arlyss Repress

## 2013-06-26 ENCOUNTER — Ambulatory Visit: Payer: Medicare Other | Admitting: Family Medicine

## 2013-07-05 ENCOUNTER — Other Ambulatory Visit: Payer: Self-pay | Admitting: Family Medicine

## 2013-07-16 ENCOUNTER — Other Ambulatory Visit: Payer: Self-pay | Admitting: Family Medicine

## 2013-07-20 ENCOUNTER — Other Ambulatory Visit: Payer: Self-pay | Admitting: Family Medicine

## 2013-07-23 ENCOUNTER — Ambulatory Visit (INDEPENDENT_AMBULATORY_CARE_PROVIDER_SITE_OTHER): Payer: Medicare Other | Admitting: Family Medicine

## 2013-07-23 ENCOUNTER — Ambulatory Visit (HOSPITAL_COMMUNITY)
Admission: RE | Admit: 2013-07-23 | Discharge: 2013-07-23 | Disposition: A | Discharge status: Home / Self Care | Payer: Medicare Other | Source: Ambulatory Visit | Attending: Family Medicine | Admitting: Family Medicine

## 2013-07-23 ENCOUNTER — Ambulatory Visit: Payer: Medicare Other | Admitting: Family Medicine

## 2013-07-23 VITALS — BP 146/110 | HR 103 | Temp 97.8°F | Wt 172.0 lb

## 2013-07-23 DIAGNOSIS — F429 Obsessive-compulsive disorder, unspecified: Secondary | ICD-10-CM

## 2013-07-23 DIAGNOSIS — F410 Panic disorder [episodic paroxysmal anxiety] without agoraphobia: Secondary | ICD-10-CM | POA: Diagnosis not present

## 2013-07-23 DIAGNOSIS — R1013 Epigastric pain: Secondary | ICD-10-CM

## 2013-07-23 DIAGNOSIS — Z23 Encounter for immunization: Secondary | ICD-10-CM

## 2013-07-23 DIAGNOSIS — F411 Generalized anxiety disorder: Secondary | ICD-10-CM | POA: Diagnosis not present

## 2013-07-23 DIAGNOSIS — M797 Fibromyalgia: Secondary | ICD-10-CM

## 2013-07-23 DIAGNOSIS — F419 Anxiety disorder, unspecified: Secondary | ICD-10-CM

## 2013-07-23 DIAGNOSIS — IMO0001 Reserved for inherently not codable concepts without codable children: Secondary | ICD-10-CM

## 2013-07-23 DIAGNOSIS — I10 Essential (primary) hypertension: Secondary | ICD-10-CM | POA: Diagnosis not present

## 2013-07-23 MED ORDER — CARISOPRODOL 350 MG PO TABS: 350.0000 mg | ORAL_TABLET | Freq: Four times a day (QID) | ORAL | Status: DC | PRN

## 2013-07-23 MED ORDER — ANAFRANIL 50 MG PO CAPS: ORAL_CAPSULE | ORAL | Status: DC

## 2013-07-23 MED ORDER — ALPRAZOLAM 2 MG PO TABS: 2.0000 mg | ORAL_TABLET | Freq: Three times a day (TID) | ORAL | Status: DC | PRN

## 2013-07-23 NOTE — Patient Instructions (Signed)
Anxiety - stop Celexa, call a psychiatrist from the list provided to set up a new patient appointment, continue Xanax and Anafrinil.   Abdominal/Chest pain - not likely your heart, the pain could be due to stress/anxiety/acid reflux.   Return to office in 1-2 months

## 2013-07-24 NOTE — Assessment & Plan Note (Signed)
Stable on current dose of Anafranil. -Patient was provided list of local psychiatrists and was counseled to make an appointment in the next few weeks. -She was advised that once she is established care with a psychiatrist that they would manage her psychiatric medications

## 2013-07-24 NOTE — Assessment & Plan Note (Addendum)
Patient presents for evaluation of epigastric pressure. Etiology of this pressure is uncertain. EKG was unremarkable suggesting not a cardiac cause. I suspect that this may be secondary to stress/anxiety as well as her acid reflux. -Patient was reassured that the etiology of her epigastric pressure is not cardiac in nature -Patient to continue omeprazole daily

## 2013-07-24 NOTE — Progress Notes (Signed)
  Subjective:    Patient ID: Jenna Burke, female    DOB: 04/25/1961, 52 y.o.   MRN: [id number]  HPI 52 year old female with past medical history of anxiety and obsessive-compulsive disorder presents for followup and medication refill.  Anxiety/obsessive-compulsive-patient previously been prescribed Celexa 10 mg daily, she did not tolerate this medication well as a temperature up at night, she discontinued medication after 2 weeks due to side effects, she currently takes Xanax 2 mg 3 times a day as needed for anxiety, she states that her anxiety is at her baseline, she does still occasionally have panic attacks, she is also on Anafranil 100 milligrams in the morning and 150 mg at night, states her mood is stable, no SI, no HI, she was previously seen by a psychiatrist by the name of Jacqulyn Ducking who had been managing her psychiatric medications, disposition retired approximately 12 months ago and her previous PCP had been prescribing her psychiatric medications, she is interested in referral to a new psychiatrist  Patient also complains of intermittent epigastric pressure sensation that radiates to bilateral diaphragms, she states that this is not related to stress or anxiety, no associated chest discomfort or shortness of breath, no associated syncope or lightheadedness, she feels that her symptoms are worse at night, no associated heartburn, no burping, no recent changes in diet, having regular BMs, no melena, no diarrhea or constipation  Social-former smoker   Review of Systems  Constitutional: Negative for fever, chills and fatigue.  Respiratory: Negative for cough, choking, shortness of breath and wheezing.   Cardiovascular: Negative for chest pain and leg swelling.  Gastrointestinal: Negative for nausea, vomiting, diarrhea, constipation and blood in stool.  Psychiatric/Behavioral: Positive for dysphoric mood. Negative for suicidal ideas and hallucinations.       Objective:   Physical  Exam Vitals: Reviewed General: Pleasant Caucasian female, appears mildly anxious HEENT: Pupils are round and reactive to light, extra amounts are intact, moist mucous members his current cardiac: Regular in rhythm, S1 and S2 present, no murmurs, no heaves or thrills Respiratory: Clear to auscultation bilaterally, normal effort Abdominal: Soft, mild epigastric tenderness, bowel sounds present all 4 quadrants, no hepatomegaly, no rebound, no guarding Extremities: No edema, 2+ radial pulses Psychiatric: Appropriately dressed, no SI, no HI   EKG: Normal sinus rhythm, no acute ST or T wave changes, PR interval of 162, QRS duration 94, QTC of 469, questionable left atrial enlargement    Assessment & Plan:  Please see problem specific assessment and plan.

## 2013-07-24 NOTE — Assessment & Plan Note (Signed)
Currently stable. -Will discontinue Celexa as patient cannot tolerate this medication as well as that she is also currently on Anafranil. -Patient was counseled to establish care with psychiatrist.

## 2013-08-10 ENCOUNTER — Ambulatory Visit: Payer: Medicare Other | Admitting: Family Medicine

## 2013-08-27 ENCOUNTER — Telehealth: Payer: Self-pay | Admitting: Family Medicine

## 2013-08-27 DIAGNOSIS — F419 Anxiety disorder, unspecified: Secondary | ICD-10-CM

## 2013-08-27 MED ORDER — ALPRAZOLAM 2 MG PO TABS: 2.0000 mg | ORAL_TABLET | Freq: Three times a day (TID) | ORAL | Status: DC | PRN

## 2013-08-27 NOTE — Telephone Encounter (Signed)
Called pt. Informed. .Jenna Burke  

## 2013-08-27 NOTE — Telephone Encounter (Signed)
Pt wants Thekla know about her Xanax refill. She will be out tomorrow and has to have it

## 2013-08-27 NOTE — Telephone Encounter (Signed)
Will fwd. To Dr.Fletke for review. .Jenna Burke  

## 2013-08-27 NOTE — Telephone Encounter (Signed)
Pt all and needs a refill on her xanax left up front. She said it has to say medically necessary on the prescription. She also wanted Dr. Randolm Idol to know that she has made an appointment with Dr. Evelene Croon who is an psychiatrist but they do not have opening until February. jw

## 2013-09-04 ENCOUNTER — Other Ambulatory Visit: Payer: Self-pay | Admitting: Family Medicine

## 2013-09-11 ENCOUNTER — Other Ambulatory Visit: Payer: Self-pay | Admitting: Family Medicine

## 2013-09-13 NOTE — Telephone Encounter (Signed)
Dr. Randolm Idol called in on 09/13/13.

## 2013-09-18 ENCOUNTER — Telehealth: Payer: Self-pay | Admitting: Family Medicine

## 2013-09-18 ENCOUNTER — Encounter: Payer: Self-pay | Admitting: Family Medicine

## 2013-09-18 ENCOUNTER — Ambulatory Visit (INDEPENDENT_AMBULATORY_CARE_PROVIDER_SITE_OTHER): Payer: Medicare Other | Admitting: Family Medicine

## 2013-09-18 VITALS — BP 150/98 | HR 98 | Temp 98.1°F | Ht 61.5 in | Wt 180.0 lb

## 2013-09-18 DIAGNOSIS — B9789 Other viral agents as the cause of diseases classified elsewhere: Secondary | ICD-10-CM

## 2013-09-18 DIAGNOSIS — B349 Viral infection, unspecified: Secondary | ICD-10-CM | POA: Insufficient documentation

## 2013-09-18 DIAGNOSIS — F411 Generalized anxiety disorder: Secondary | ICD-10-CM | POA: Diagnosis not present

## 2013-09-18 DIAGNOSIS — F419 Anxiety disorder, unspecified: Secondary | ICD-10-CM

## 2013-09-18 MED ORDER — ALPRAZOLAM 2 MG PO TABS: 2.0000 mg | ORAL_TABLET | Freq: Three times a day (TID) | ORAL | Status: DC | PRN

## 2013-09-18 NOTE — Telephone Encounter (Signed)
Called. Advised to use OTC meds and plenty of rest and fluid intake. She still request meds to be called in to the pharmacy. I told her, that I would send the message to Dr.Fletke. [name], [name]

## 2013-09-18 NOTE — Progress Notes (Signed)
   Subjective:    Patient ID: Jenna Burke, female    DOB: 1961-01-14, 52 y.o.   MRN: [id number]  HPI 52 year old female presents with five-day history of cough, has been nonproductive, she is a previous smoker however reports no recent tobacco use, has had one day history of associated sore throat, associated congestion and rhinorrhea, no sick contacts, did have some relief of her symptoms with over-the-counter Advil, has previously been able to tolerate over-the-counter Mucinex secondary to medication side effect, no emesis, no diarrhea, no abdominal pain, does report some associated body aches   Review of Systems  Constitutional: Positive for chills and fatigue. Negative for fever.  HENT: Positive for congestion and rhinorrhea.   Respiratory: Positive for cough. Negative for choking and chest tightness.   Cardiovascular: Negative for chest pain.  Gastrointestinal: Negative for vomiting, diarrhea and constipation.  Skin: Negative for rash.       Objective:   Physical Exam Vitals: Reviewed General: Pleasant Caucasian female, no acute distress, appears anxious HEENT: Normocephalic, bilateral TMs are pearly-gray without exudate or bulging, pupils are equal round and reactive to light, extraocular movements are intact, no scleral icterus, rhinorrhea present, moist mucous membranes, no pharyngeal erythema or exudate noted, neck was supple with some mild diffuse tenderness, no cervical adenopathy was appreciated Cardiac: Regular rate and rhythm, S1 and S2 present, no murmurs, no heaves or thrills Respiratory: Clear to auscultation bilaterally, normal effort Abdomen: Soft, nontender, bowel sounds present       Assessment & Plan:  Please see problem specific assessment and plan.

## 2013-09-18 NOTE — Telephone Encounter (Signed)
Sister Ms Maryclare Bean wants to know why pt was not prescribed any cough medicine. She was not given anything and she wants to know why Please advise

## 2013-09-18 NOTE — Assessment & Plan Note (Signed)
Patient presents with signs and symptoms consistent with viral URI. -Conservative management discussed including adequate by mouth fluid intake, Motrin and Tylenol as needed for fevers and chills, rest

## 2013-09-18 NOTE — Patient Instructions (Signed)
Viral Infections A virus is a type of germ. Viruses can cause:  Minor sore throats.  Aches and pains.  Headaches.  Runny nose.  Rashes.  Watery eyes.  Tiredness.  Coughs.  Loss of appetite.  Feeling sick to your stomach (nausea).  Throwing up (vomiting).  Watery poop (diarrhea). HOME CARE   Only take medicines as told by your doctor.  Drink enough water and fluids to keep your pee (urine) clear or pale yellow. Sports drinks are a good choice.  Get plenty of rest and eat healthy. Soups and broths with crackers or rice are fine. GET HELP RIGHT AWAY IF:   You have a very bad headache.  You have shortness of breath.  You have chest pain or neck pain.  You have an unusual rash.  You cannot stop throwing up.  You have watery poop that does not stop.  You cannot keep fluids down.  You or your child has a temperature by mouth above 102 F (38.9 C), not controlled by medicine.  Your baby is older than 3 months with a rectal temperature of 102 F (38.9 C) or higher.  Your baby is 42 months old or younger with a rectal temperature of 100.4 F (38 C) or higher. MAKE SURE YOU:   Understand these instructions.  Will watch this condition.  Will get help right away if you are not doing well or get worse. Document Released: 08/26/2008 Document Revised: 12/06/2011 Document Reviewed: 01/19/2011 [facility] Patient Information 2014 [place], [place].  May take Motrin and Tylenol for body aches. May also take over the counter cough medication to help decrease cough.

## 2013-09-18 NOTE — Telephone Encounter (Signed)
Called and spoke to Ms. Staley. Explained that Jenna Burke has a viral infection and does not need any additional therapy than what was discussed at appointment (tylenol/motrin/otc cough medication). No need for antibiotic at this time as no signs/symptoms of pneumonia. Counseled that is she develops fevers or worsening cough/productive sputum/chest pain to make follow up appointment or go to urgent care.

## 2013-10-23 ENCOUNTER — Ambulatory Visit (INDEPENDENT_AMBULATORY_CARE_PROVIDER_SITE_OTHER): Payer: Medicare Other | Admitting: Family Medicine

## 2013-10-23 VITALS — BP 130/82 | HR 96 | Temp 97.8°F | Ht 61.5 in | Wt 180.3 lb

## 2013-10-23 DIAGNOSIS — R05 Cough: Secondary | ICD-10-CM | POA: Diagnosis not present

## 2013-10-23 DIAGNOSIS — F410 Panic disorder [episodic paroxysmal anxiety] without agoraphobia: Secondary | ICD-10-CM

## 2013-10-23 DIAGNOSIS — R1013 Epigastric pain: Secondary | ICD-10-CM | POA: Diagnosis not present

## 2013-10-23 DIAGNOSIS — F411 Generalized anxiety disorder: Secondary | ICD-10-CM

## 2013-10-23 DIAGNOSIS — R0789 Other chest pain: Secondary | ICD-10-CM

## 2013-10-23 DIAGNOSIS — F419 Anxiety disorder, unspecified: Secondary | ICD-10-CM

## 2013-10-23 DIAGNOSIS — R059 Cough, unspecified: Secondary | ICD-10-CM

## 2013-10-23 MED ORDER — ALPRAZOLAM 2 MG PO TABS
2.0000 mg | ORAL_TABLET | Freq: Three times a day (TID) | ORAL | Status: DC | PRN
Start: 2013-10-23 — End: 2013-11-23

## 2013-10-23 MED ORDER — CARISOPRODOL 350 MG PO TABS: 350.0000 mg | ORAL_TABLET | Freq: Three times a day (TID) | ORAL | Status: DC

## 2013-10-23 NOTE — Patient Instructions (Signed)
Cough - likely due to Acid reflux and nasal congestion. May use over the counter nasal decongestants/saline drops.   Acid Reflux - continue Omeprazole and take Tums as needed  Side/Back Pain/Chest - related to muscle tension/anxiety, please call [facility] to make an appointment as soon as possible, may use heating pad to the affected areas  Please follow up in one month.

## 2013-10-24 DIAGNOSIS — R0789 Other chest pain: Secondary | ICD-10-CM | POA: Insufficient documentation

## 2013-10-24 DIAGNOSIS — R059 Cough, unspecified: Secondary | ICD-10-CM | POA: Insufficient documentation

## 2013-10-24 DIAGNOSIS — R05 Cough: Secondary | ICD-10-CM | POA: Insufficient documentation

## 2013-10-24 HISTORY — DX: Other chest pain: R07.89

## 2013-10-24 NOTE — Assessment & Plan Note (Signed)
Cough likely secondary to URI symptoms as well as acid reflux. -Counseled to use over-the-counter nasal decongestants and to continue her proton pump inhibitor

## 2013-10-24 NOTE — Assessment & Plan Note (Signed)
Patient presents for evaluation of atypical chest pain and epigastric pressure. Previous cardiac evaluation including EKG in office was unremarkable. Symptoms likely multifactorial from acid reflux, muscle skeletal tenderness due to anxiety, and fibromyalgia. -Patient counseled on likely benign etiology of her symptoms -Counseled to make appointment with [facility] as soon as possible for behavioral therapy

## 2013-10-24 NOTE — Assessment & Plan Note (Signed)
Anxiety remains uncontrolled despite to the use of Anafranil and Xanax. -Patient given information on behavioral counseling at [facility]

## 2013-10-24 NOTE — Progress Notes (Signed)
   Subjective:    Patient ID: Jenna Burke, female    DOB: 1960-10-13, 53 y.o.   MRN: [id number]  HPI 53 year old female presents for followup of multiple medical conditions.  Patient reports cough for approximately the past 2 months, she was diagnosed with a viral URI approximately one month ago and she's continued to have some nasal congestion and postnasal drip, denies recent fevers or chills, no current sore throat, no ear pain or pressure, cough has been nonproductive as of late, no associated shortness of breath, no wheezing, she reports the cough is worse with lying down  Epigastric/substernal chest pain- occurs intermittently and resolves spontaneously, no association to activity, radiation to bilateral diaphragms and around to the back, no radiation to upper chest /jaw /left arm. patient last had this experience 3 days ago and is relieved with TUMS and milk, she also reports improvement with Xanax, she describes the sensation as a heavy type sensation with some occasional burning, patient has previously had EKG in the family practice office which was unremarkable, patient does take soma daily for history of fibromyalgia which provided minimal relief of the pain  Anxiety/OCD-patient continues to take a Xanax and Anafranil daily, she does need refill of her Xanax, she is previously given a list of psychiatrists in the [place] area however she reports that none of them take Medicare as she is unable to afford the out-of-pocket cost, she's wondering if there is another provider that she can see that she is highly motivated to seek further psychiatric care  Social-former smoker, lives alone   Review of Systems  Constitutional: Negative for fever, chills and fatigue.  HENT: Positive for congestion, postnasal drip and rhinorrhea. Negative for ear discharge, ear pain and sore throat.   Respiratory: Positive for cough. Negative for apnea, choking, chest tightness and wheezing.     Cardiovascular: Positive for chest pain. Negative for palpitations and leg swelling.  Gastrointestinal: Positive for abdominal pain. Negative for nausea, vomiting, diarrhea and constipation.       Objective:   Physical Exam Vitals: Reviewed General: Pleasant Caucasian female, appears anxious throughout the examination, accompanied by her friend HEENT: Pupils are equal round and reactive to light, extraocular motor intact, no scleral icterus, nasal septum midline, minimal bogginess of the nasal turbinates, moist mucous membranes, uvula midline, neck was supple, no anterior posterior cervical lymphadenopathy Cardiac: Regular rate and rhythm, S1-S2 present, no murmurs, no heaves or thrills, reproduction of chest pain with palpation over the sternum Respiratory: Clear to auscultation bilaterally, normal effort Abdomen: Soft, epigastric tenderness, normal bowel sounds, no rebound, no guarding MSK: Patient tender to palpation under bilateral diaphragms as well as over the bilateral thoracic paraspinal area       Assessment & Plan:  Please see problem specific assessment and plan.

## 2013-10-24 NOTE — Assessment & Plan Note (Signed)
Patient continues to have intermittent epigastric pressure in the substernal pain most consistent with a musculoskeletal etiology due to stress, anxiety, fibromyalgia. -Counseled to seek behavioral therapy at [facility] -Continue Xanax as needed and daily Anafranil -Continue daily proton pump inhibitor to treat acid reflux

## 2013-11-23 ENCOUNTER — Telehealth: Payer: Self-pay | Admitting: Family Medicine

## 2013-11-23 DIAGNOSIS — F419 Anxiety disorder, unspecified: Secondary | ICD-10-CM

## 2013-11-23 MED ORDER — ALPRAZOLAM 2 MG PO TABS: 2.0000 mg | ORAL_TABLET | Freq: Three times a day (TID) | ORAL | Status: DC | PRN

## 2013-11-23 NOTE — Telephone Encounter (Signed)
Will fwd to MD.  Yashira Offenberger L, CMA  

## 2013-11-23 NOTE — Telephone Encounter (Signed)
Note to red team - please call in prescription for Xanax 90 tablets (refill already put in [facility]). Call patient to let her know that prescription has been sent, thanks.

## 2013-11-23 NOTE — Telephone Encounter (Signed)
1.) called in meds 2.) called pt. Informed. Javier Glazier, Gerrit Heck

## 2013-11-23 NOTE — Telephone Encounter (Signed)
Pt called to let the doctor know that she will be out of her Xanax this weekend. jw

## 2013-11-23 NOTE — Telephone Encounter (Signed)
Patient request reill on Xanax. Please call when ready for pick up. Patient will keep appt for 11/27/13.

## 2013-11-26 ENCOUNTER — Other Ambulatory Visit: Payer: Self-pay | Admitting: Family Medicine

## 2013-11-27 ENCOUNTER — Ambulatory Visit: Payer: Medicare Other | Admitting: Family Medicine

## 2013-11-30 ENCOUNTER — Other Ambulatory Visit: Payer: Self-pay | Admitting: Family Medicine

## 2013-11-30 NOTE — Telephone Encounter (Signed)
Soma called in to [facility] per MD request.   Lazaro Arms, CMA

## 2013-11-30 NOTE — Telephone Encounter (Signed)
Note to RN staff - please call in Soma 350 mg TID PRN, dispense #100, Refill #0, thanks

## 2013-12-21 ENCOUNTER — Telehealth: Payer: Self-pay | Admitting: Family Medicine

## 2013-12-21 NOTE — Telephone Encounter (Signed)
Refill request for Xanax.  

## 2013-12-24 ENCOUNTER — Telehealth: Payer: Self-pay | Admitting: Family Medicine

## 2013-12-24 DIAGNOSIS — F419 Anxiety disorder, unspecified: Secondary | ICD-10-CM

## 2013-12-24 MED ORDER — ALPRAZOLAM 2 MG PO TABS: 2.0000 mg | ORAL_TABLET | Freq: Three times a day (TID) | ORAL | Status: DC | PRN

## 2013-12-24 NOTE — Telephone Encounter (Signed)
Called pt. LMVM to pick up rx. Javier Glazier, Gerrit Heck

## 2013-12-24 NOTE — Telephone Encounter (Signed)
Will fwd to MD.  Lashika Erker L, CMA  

## 2013-12-24 NOTE — Telephone Encounter (Signed)
Note to nursing staff - please call patient for pickup of Xanax, prescription placed in front office.

## 2013-12-24 NOTE — Telephone Encounter (Signed)
Pt is calling to check the status of her Xanax refill .Please call when ready. jw

## 2013-12-25 ENCOUNTER — Other Ambulatory Visit: Payer: Self-pay | Admitting: Family Medicine

## 2014-01-08 ENCOUNTER — Other Ambulatory Visit: Payer: Self-pay | Admitting: Family Medicine

## 2014-01-08 NOTE — Telephone Encounter (Signed)
Phoned in rx refill for soma and prilosec.

## 2014-01-21 ENCOUNTER — Telehealth: Payer: Self-pay | Admitting: Family Medicine

## 2014-01-21 DIAGNOSIS — F419 Anxiety disorder, unspecified: Secondary | ICD-10-CM

## 2014-01-21 NOTE — Telephone Encounter (Signed)
Needs refills on her Alger Simons

## 2014-01-22 NOTE — Telephone Encounter (Signed)
LMVM to call back. Please ask pt, which Rx she needs. Thanks. Mauricia Area

## 2014-01-22 NOTE — Telephone Encounter (Signed)
Pt called and wanted to know when her prescription was going to be ready. Plus she would like [facility] to call her. jw

## 2014-01-23 MED ORDER — ALPRAZOLAM 2 MG PO TABS: 2.0000 mg | ORAL_TABLET | Freq: Three times a day (TID) | ORAL | Status: DC | PRN

## 2014-01-23 NOTE — Telephone Encounter (Signed)
Pt requesting Xanax.  [place]

## 2014-01-23 NOTE — Telephone Encounter (Signed)
LMVM for pt.  [place]

## 2014-01-23 NOTE — Telephone Encounter (Signed)
To nursing staff - please call patient to let her know that her xanax prescription has been placed in the front office for pickup.

## 2014-02-07 ENCOUNTER — Other Ambulatory Visit: Payer: Self-pay | Admitting: Family Medicine

## 2014-02-07 NOTE — Telephone Encounter (Signed)
Note to nursing staff. Please call in SOMA 350 mg TID PRN pain, Dispense #90, Refill #0

## 2014-02-08 ENCOUNTER — Telehealth: Payer: Self-pay | Admitting: Family Medicine

## 2014-02-11 ENCOUNTER — Telehealth: Payer: Self-pay | Admitting: Family Medicine

## 2014-02-11 NOTE — Telephone Encounter (Signed)
Called Rx into pharmacy and called and notified patient. She wants to schedule an appointment with Dr Ree Kida so I so her to call the front desk and they would get her an appointment scheduled.Jenna Burke

## 2014-02-11 NOTE — Telephone Encounter (Signed)
Forward to PCP for refill.Robert L Busick  

## 2014-02-11 NOTE — Telephone Encounter (Signed)
Spoke with Schuyler Amor who will call in prescription.

## 2014-02-11 NOTE — Telephone Encounter (Signed)
Pt called because the pharmacy does not have the prescription for Carisoprodol. Can we call this in. jw

## 2014-02-12 NOTE — Telephone Encounter (Signed)
RX called in and patient informed.Jenna Burke [facility]

## 2014-02-21 NOTE — Telephone Encounter (Signed)
Jenna Burke need to pick up rx for her Xanax.  Would like to have tomorrow

## 2014-02-22 ENCOUNTER — Telehealth: Payer: Self-pay | Admitting: Family Medicine

## 2014-02-22 ENCOUNTER — Other Ambulatory Visit: Payer: Self-pay | Admitting: Family Medicine

## 2014-02-22 DIAGNOSIS — F419 Anxiety disorder, unspecified: Secondary | ICD-10-CM

## 2014-02-22 MED ORDER — ALPRAZOLAM 2 MG PO TABS: 2.0000 mg | ORAL_TABLET | Freq: Three times a day (TID) | ORAL | Status: DC | PRN

## 2014-02-22 NOTE — Telephone Encounter (Signed)
Prescription placed in front office for pickup, please remind patient that if she needs a refill she will need to request it at least 72 business hours ahead of when she needs it. Thanks.

## 2014-02-22 NOTE — Telephone Encounter (Signed)
Prescription for Xanax placed in front office for pickup.

## 2014-02-22 NOTE — Telephone Encounter (Signed)
Called and informed patient that her Rx is at the front desk ready for her to pick up.Robert L Busick  

## 2014-02-22 NOTE — Telephone Encounter (Signed)
Forward to PCP for refill request.Robert L Busick

## 2014-02-22 NOTE — Telephone Encounter (Signed)
Pt called and needs a refill on her xanax left up front and don't forget to include on the prescription that this medically necessary. jw

## 2014-03-13 ENCOUNTER — Other Ambulatory Visit: Payer: Self-pay | Admitting: Family Medicine

## 2014-03-14 NOTE — Telephone Encounter (Signed)
LVM for patien to call us back. I have called in RX and give below message

## 2014-03-14 NOTE — Telephone Encounter (Signed)
Patient calls today, completely out of meds. Please advise.

## 2014-03-14 NOTE — Telephone Encounter (Signed)
Note to nursing staff - please call in Soma 350 mg one tablet TID PRN, dispense #100, refill #0, please also call patient once called in, please advise patient to request prescriptions at least one week before she is going to run out, thanks

## 2014-03-25 ENCOUNTER — Telehealth: Payer: Self-pay | Admitting: Family Medicine

## 2014-03-25 DIAGNOSIS — F419 Anxiety disorder, unspecified: Secondary | ICD-10-CM

## 2014-03-25 NOTE — Telephone Encounter (Signed)
Pt called and needs a refill on xanax left up front for pickup. Don't forget that it has to say that Xanax brand is medically necessary on the prescription. jw

## 2014-03-26 ENCOUNTER — Encounter: Payer: Self-pay | Admitting: Family Medicine

## 2014-03-26 ENCOUNTER — Other Ambulatory Visit: Payer: Self-pay | Admitting: Family Medicine

## 2014-03-26 ENCOUNTER — Ambulatory Visit (INDEPENDENT_AMBULATORY_CARE_PROVIDER_SITE_OTHER): Payer: Medicare Other | Admitting: Family Medicine

## 2014-03-26 VITALS — BP 109/75 | HR 86 | Temp 98.0°F | Resp 18 | Wt 160.0 lb

## 2014-03-26 DIAGNOSIS — J069 Acute upper respiratory infection, unspecified: Secondary | ICD-10-CM

## 2014-03-26 MED ORDER — ALPRAZOLAM 2 MG PO TABS: 2.0000 mg | ORAL_TABLET | Freq: Three times a day (TID) | ORAL | Status: DC | PRN

## 2014-03-26 MED ORDER — HYDROCODONE-CHLORPHENIRAMINE 5-4 MG/5ML PO SOLN: 5.0000 mL | Freq: Four times a day (QID) | ORAL | Status: DC | PRN

## 2014-03-26 MED ORDER — HYDROCODONE-CHLORPHENIRAMINE 5-4 MG/5ML PO SOLN: 5.0000 mL | Freq: Two times a day (BID) | ORAL | Status: DC | PRN

## 2014-03-26 MED ORDER — GUAIFENESIN ER 600 MG PO TB12: 1200.0000 mg | ORAL_TABLET | Freq: Two times a day (BID) | ORAL | Status: DC

## 2014-03-26 NOTE — Telephone Encounter (Signed)
Patient provided hard prescription while she was seen for acute visit today.

## 2014-03-26 NOTE — Addendum Note (Signed)
Addended by: Leone Haven on: 03/26/2014 02:14 PM   Modules accepted: Orders

## 2014-03-26 NOTE — Patient Instructions (Signed)
Nice to see you. You likely have a bronchitis that is due to a virus. This should get better on its own and may take several weeks for the cough to improve. Please use the cough syrup and mucinex that has been prescribed. This may cause you to be drowsy, so please be careful the first time you take this.\ Please be sure to drink plenty of water while you are sick.

## 2014-03-26 NOTE — Assessment & Plan Note (Signed)
Patient with signs and symptoms of URI. Could also be a bronchitis, though there is no wheezing on exam. Discussed that this is viral in nature. Nothing on exam to indicate a PNA. Will treat cough with hydrocodone-chlorpheniramine. Mucinex and plenty of water to help with mucus. This will likely take several weeks for the cough to completely resolve. Given return precautions. F/u prn.

## 2014-03-26 NOTE — Progress Notes (Signed)
Patient ID: [name], female   DOB: 1961/03/07, 53 y.o.   MRN: [id number]  Jenna Rumps, MD Phone: [phone]  Jenna Burke is a 53 y.o. female who presents today for same day appt.  URI: patient complains of sore throat starting last week. The sore throat went away and then developed a cough. This is non-productive. Notes some wheezing. Notes ear ache as well. Denies fevers, nausea, vomiting, and diarrhea. Denies shortness of breath. She has been taking OTC anti-cough medication with minimal benefit. She also tried her sisters "puffer" and this did not help. She is drinking well. Cough has not been getting better  Additionally patient asked for refill of xanax from her PCP, Dr Jenna Burke, who was precepting today. He came and spoke to the patient. He refilled her xanax and discussed going to [facility] to be seen. He provided her with the phone number for [facility].  Patient is a former smoker.   ROS: Per HPI   Physical Exam Filed Vitals:   03/26/14 1030  BP: 109/75  Pulse: 86  Temp: 98 F (36.7 C)  Resp: 18  O2 sat 100%  Gen: Well NAD HEENT: PERRL,  MMM, bilateral TM normal, no cervical LAD Lungs: CTABL Nl WOB, no wheezing noted Heart: RRR no MRG Exts: Non edematous BL  LE, warm and well perfused.    Assessment/Plan: Please see individual problem list.  # Healthcare maintenance: not addressed

## 2014-04-03 ENCOUNTER — Other Ambulatory Visit: Payer: Self-pay | Admitting: Family Medicine

## 2014-04-09 ENCOUNTER — Encounter: Payer: Self-pay | Admitting: Family Medicine

## 2014-04-09 ENCOUNTER — Ambulatory Visit (INDEPENDENT_AMBULATORY_CARE_PROVIDER_SITE_OTHER): Payer: Medicare Other | Admitting: Family Medicine

## 2014-04-09 VITALS — BP 130/89 | HR 98 | Temp 98.0°F | Wt 160.0 lb

## 2014-04-09 DIAGNOSIS — R059 Cough, unspecified: Secondary | ICD-10-CM

## 2014-04-09 DIAGNOSIS — F410 Panic disorder [episodic paroxysmal anxiety] without agoraphobia: Secondary | ICD-10-CM | POA: Diagnosis not present

## 2014-04-09 DIAGNOSIS — R05 Cough: Secondary | ICD-10-CM | POA: Diagnosis not present

## 2014-04-09 DIAGNOSIS — L290 Pruritus ani: Secondary | ICD-10-CM | POA: Diagnosis not present

## 2014-04-09 MED ORDER — FLUTICASONE PROPIONATE 50 MCG/ACT NA SUSP: 2.0000 | Freq: Every day | NASAL | Status: DC

## 2014-04-09 NOTE — Progress Notes (Signed)
   Subjective:    Patient ID: Jenna Burke, female    DOB: 24-Dec-1960, 53 y.o.   MRN: [id number]  HPI 53 y/o female presents for routine follow up  Cough/congestion - patient evaluated in office a few weeks ago and diagnosed with viral URI, provided prescription for Mucinex and Tylenol with codeine, symptoms have improved however continues to have nasal congestion, minimal cough, no sore throat, no fevers/chills  Anxiety - stable on Xanax and Anafranil, patient has called [facility] and was informed that she needs to walk in for her initial appointment/evaluation, she plans to do so in the next 1-2 weeks, she is aware that her behavioral health medication regimen will need to be transitioned to [facility]  Anal itching - has been intermittent over the past few months, no constipation/diarrhea, no melena, occasional blood on tissue paper, no nausea/emesis/abcominal pain  Social - former smoker   Review of Systems  Constitutional: Negative for fever, chills and fatigue.  HENT: Positive for congestion, postnasal drip and rhinorrhea.   Respiratory: Negative for cough and shortness of breath.   Cardiovascular: Negative for chest pain.  Gastrointestinal: Negative for nausea, vomiting, abdominal pain, diarrhea, constipation and blood in stool.       Objective:   Physical Exam Vitals: reviewed Gen: pleasant female, mildly anxious, sister is present HEENT: normocephalic, PERRL, EOMI, bilateral TM's pearly grey, no rhinorrhea, mild turbinate erythema/bogginess, MMM, no pharyngeal erythema or exudate, neck supple, no anterior or posterior cervical lymphadenopathy Cardiac: RRR, S1 and S2 present, no murmurs, no heaves/thrills Resp: CTAB, normal effort Rectal: no anal fissures, no hemorrhoids, no palpable masses on DRE, no stool in vault, attempted Hemoccult card (negative however minimal stool)     Assessment & Plan:  Please see problem specific assessment and plan.

## 2014-04-09 NOTE — Patient Instructions (Addendum)
Anxiety - please follow up with [facility]  Note to [facility] - Dr. Ree Kida wishes to have [facility] assume control of prescribing all behavioral health medications.   Anal Itching - normal exam today, please collect stool sample and bring to office next week  Nasal congestion - start Flonase daily  Return for yearly preventative exam in one month.

## 2014-04-10 DIAGNOSIS — L290 Pruritus ani: Secondary | ICD-10-CM | POA: Insufficient documentation

## 2014-04-10 NOTE — Assessment & Plan Note (Signed)
Cough is improved however still having significant nasal congestion -attempt trial of Flonase

## 2014-04-10 NOTE — Assessment & Plan Note (Signed)
Stable on Anafranil and Xanax -patient planning to do initial intake at [facility] in next 1-2 weeks -transition medications to [place] once extablished

## 2014-04-22 ENCOUNTER — Telehealth: Payer: Self-pay | Admitting: Family Medicine

## 2014-04-22 DIAGNOSIS — F419 Anxiety disorder, unspecified: Secondary | ICD-10-CM

## 2014-04-22 NOTE — Telephone Encounter (Signed)
Pt called and needs a refill on her Xanax and it must say medically necessary on the prescription. Please call when ready for pickup. jw

## 2014-04-23 MED ORDER — ALPRAZOLAM 2 MG PO TABS: 2.0000 mg | ORAL_TABLET | Freq: Three times a day (TID) | ORAL | Status: DC | PRN

## 2014-04-23 MED ORDER — ALPRAZOLAM 2 MG PO TABS
2.0000 mg | ORAL_TABLET | Freq: Three times a day (TID) | ORAL | Status: DC | PRN
Start: 2014-04-23 — End: 2014-05-24

## 2014-04-23 NOTE — Telephone Encounter (Signed)
Note to nursing staff - please call patient and tell her that Xanax was placed in the front office.

## 2014-04-23 NOTE — Telephone Encounter (Signed)
Pt called again and wants to know when the doctor id going to fill her prescription. Please call her and let her know. jw

## 2014-04-24 NOTE — Telephone Encounter (Signed)
Left message on voicemail that rx was ready to be picked up. 

## 2014-05-07 ENCOUNTER — Encounter: Payer: Self-pay | Admitting: Family Medicine

## 2014-05-07 LAB — POCT PINWORM SLIDE: Pinworm Slide: NEGATIVE

## 2014-05-07 NOTE — Addendum Note (Signed)
Addended by: [place], Yzabella Crunk on: 05/07/2014 11:20 AM   Modules accepted: Orders

## 2014-05-13 ENCOUNTER — Other Ambulatory Visit: Payer: Self-pay | Admitting: Family Medicine

## 2014-05-13 NOTE — Telephone Encounter (Signed)
Note to nursing staff - please call in Soma 350 mg one tablet every 8 hours as needed, dispense #100, refill #0 (tell pharmacy not to fill until 05/20/14)

## 2014-05-14 ENCOUNTER — Telehealth: Payer: Self-pay | Admitting: Family Medicine

## 2014-05-14 ENCOUNTER — Ambulatory Visit: Payer: Medicare Other | Admitting: Family Medicine

## 2014-05-14 MED ORDER — CARISOPRODOL 350 MG PO TABS: 350.0000 mg | ORAL_TABLET | Freq: Three times a day (TID) | ORAL | Status: DC | PRN

## 2014-05-14 NOTE — Telephone Encounter (Signed)
Refill called into pharmacy, no fill until 8/24 (per pharmacy last pick up was on 04/19/14)  Note to nursing staff - please call patient to let her know that prescription sent to pharmacy

## 2014-05-14 NOTE — Telephone Encounter (Signed)
Left message on voicemail informing patient that rx would be available at her pharmacy for pickup on 8/24.

## 2014-05-14 NOTE — Telephone Encounter (Signed)
Pt called and would like a refill on her Carisoprodol called in. jw

## 2014-05-23 ENCOUNTER — Telehealth: Payer: Self-pay | Admitting: Family Medicine

## 2014-05-23 DIAGNOSIS — F419 Anxiety disorder, unspecified: Secondary | ICD-10-CM

## 2014-05-23 NOTE — Telephone Encounter (Signed)
Pt called and needs a refill on her Xanax left up front for pick up. Please do not forget to put medically necessary on the prescription. jw

## 2014-05-24 MED ORDER — ALPRAZOLAM 2 MG PO TABS
2.0000 mg | ORAL_TABLET | Freq: Three times a day (TID) | ORAL | Status: DC | PRN
Start: 2014-05-24 — End: 2014-06-20

## 2014-05-24 NOTE — Telephone Encounter (Signed)
Pt called back and was checking the status of her refill request. jw

## 2014-05-24 NOTE — Telephone Encounter (Signed)
Spoke to patient. She has gone to [facility] however was not evaluated as the "line was too long", she is aware that she needs to be seen as soon as possible for her behavioral health issues.

## 2014-06-04 ENCOUNTER — Ambulatory Visit: Payer: Medicare Other | Admitting: Family Medicine

## 2014-06-13 ENCOUNTER — Other Ambulatory Visit: Payer: Self-pay | Admitting: Family Medicine

## 2014-06-13 ENCOUNTER — Encounter: Payer: Self-pay | Admitting: Family Medicine

## 2014-06-13 ENCOUNTER — Ambulatory Visit
Admission: RE | Admit: 2014-06-13 | Discharge: 2014-06-13 | Disposition: A | Discharge status: Home / Self Care | Payer: Medicare Other | Source: Ambulatory Visit | Attending: Family Medicine | Admitting: Family Medicine

## 2014-06-13 ENCOUNTER — Ambulatory Visit (INDEPENDENT_AMBULATORY_CARE_PROVIDER_SITE_OTHER): Payer: Medicare Other | Admitting: Family Medicine

## 2014-06-13 VITALS — BP 138/92 | HR 100 | Temp 98.3°F | Ht 61.0 in | Wt 159.1 lb

## 2014-06-13 DIAGNOSIS — W19XXXA Unspecified fall, initial encounter: Secondary | ICD-10-CM

## 2014-06-13 DIAGNOSIS — S298XXA Other specified injuries of thorax, initial encounter: Secondary | ICD-10-CM | POA: Diagnosis not present

## 2014-06-13 DIAGNOSIS — M546 Pain in thoracic spine: Secondary | ICD-10-CM

## 2014-06-13 DIAGNOSIS — R079 Chest pain, unspecified: Secondary | ICD-10-CM | POA: Diagnosis not present

## 2014-06-13 DIAGNOSIS — IMO0002 Reserved for concepts with insufficient information to code with codable children: Secondary | ICD-10-CM | POA: Diagnosis not present

## 2014-06-13 DIAGNOSIS — M549 Dorsalgia, unspecified: Secondary | ICD-10-CM | POA: Diagnosis not present

## 2014-06-13 MED ORDER — HYDROCODONE-ACETAMINOPHEN 10-325 MG PO TABS: 1.0000 | ORAL_TABLET | Freq: Three times a day (TID) | ORAL | Status: DC | PRN

## 2014-06-13 MED ORDER — MELOXICAM 15 MG PO TABS: 15.0000 mg | ORAL_TABLET | Freq: Every day | ORAL | Status: DC

## 2014-06-13 NOTE — Patient Instructions (Signed)
It was nice to meet you today!  We are checking xrays to make sure you didn't fracture anything. I am prescribing norco, which is a strong pain medicine. Use it as needed for severe pain. This will not be a long-term medication. Also take mobic daily for a week, then daily as needed after that.  Don't mix tylenol with the norco since it contains tylenol. Do NOT take aleve, advil, ibuprofen, etc while on the mobic.  Follow up in 2-3 weeks if you aren't feeling better, or sooner if things worsen.  Be well, Dr. Ardelia Mems

## 2014-06-14 ENCOUNTER — Telehealth: Payer: Self-pay | Admitting: Family Medicine

## 2014-06-14 NOTE — Telephone Encounter (Signed)
Pt informed. Kayven Aldaco CMA 

## 2014-06-14 NOTE — Telephone Encounter (Signed)
Pt called and would like to know what x-rays showed. Please call with results. jw

## 2014-06-14 NOTE — Telephone Encounter (Signed)
Please call pt and inform that xrays did not show any fractures.  Thanks, Leeanne Rio, MD

## 2014-06-14 NOTE — Telephone Encounter (Signed)
Pt calls again.

## 2014-06-17 NOTE — Progress Notes (Signed)
Patient ID: Jenna Burke, female   DOB: 1961/02/04, 53 y.o.   MRN: [id number]  HPI:  Pt presents for a same day appointment to discuss pain in side after falling.  She was going up a ramp when she tripped on some boards. Fell on her L side. Did not hit head. Has pain on L side now. No weakness in legs, shooting pains down legs, fevers, crotch numbness. Stooling normally, urinating normally. States can't blow nose because she has pain in her L side. Tried tylenol arthritis which helped a little.   ROS: See HPI  [place]: hx obesity, anxiety, fibromyalgia, hypothyroidism  PHYSICAL EXAM: BP 138/92  Pulse 100  Temp(Src) 98.3 F (36.8 C) (Oral)  Ht 5\' 1"  (1.549 m)  Wt 159 lb 1.6 oz (72.167 kg)  BMI 30.08 kg/m2 Gen: NAD HEENT: NCAT, full ROM of neck Heart: RRR Lungs: CTAB, NWOB Abdomen: soft, NTTP Back: TTP over left side/ribs, also mild TTP in thoracic and lumbar areas of spine Neuro: full strength bilat upper and lower extremities, no patellar hyperreflexia either side  ASSESSMENT/PLAN:  1. Pain after fall: suspect MSK strain but as pt has some bony tenderness will obtain plain film imaging. - xray thoracic spine, left ribs, lumbar spine - rx short term course of norco prn severe pain (counseled pt that this will not be long term medication) - also meloxicam daily x 7 days then daily prn for antiinflammatory - f/u if not improving  FOLLOW UP: F/u as needed if symptoms worsen or do not improve.   [place]. Ardelia Mems, [place]

## 2014-06-19 ENCOUNTER — Telehealth: Payer: Self-pay | Admitting: Family Medicine

## 2014-06-19 DIAGNOSIS — F419 Anxiety disorder, unspecified: Secondary | ICD-10-CM

## 2014-06-19 NOTE — Telephone Encounter (Signed)
zanax and carisoprodol--needs refills RX need to say they are medically necessary She will pick up when ready

## 2014-06-20 ENCOUNTER — Telehealth: Payer: Self-pay | Admitting: Family Medicine

## 2014-06-20 MED ORDER — GABAPENTIN 100 MG PO CAPS: 100.0000 mg | ORAL_CAPSULE | Freq: Three times a day (TID) | ORAL | Status: DC

## 2014-06-20 MED ORDER — CARISOPRODOL 350 MG PO TABS: 350.0000 mg | ORAL_TABLET | Freq: Two times a day (BID) | ORAL | Status: DC | PRN

## 2014-06-20 MED ORDER — ALPRAZOLAM 2 MG PO TABS: 2.0000 mg | ORAL_TABLET | Freq: Three times a day (TID) | ORAL | Status: DC | PRN

## 2014-06-20 NOTE — Telephone Encounter (Signed)
Called patient to follow up on medications. She is requesting refill on Xanax, she has not yet gone to [place], prior to refill last month we discussed the importance of her going to [place] (plan is to transfer control of anxiety meds to psyciatry once established), patient informed that she needs to see [facility] before next refill in one month  She also request refill on Soma, this is a longstanding medication for her, she reports taking this for "fibromyalgia of her legs", I informed her that this is typically a short term medication, after discussion we agree to start Gabapentin 100 mg TID, will decrease dose of Soma to BID PRN (previously TID PRN), plan is to slow taper off of Soma  Refills of Xanax and Soma called into pharmacy, also called in Gabapentin. Patient told to schedule follow up in office for annual well visit and separate visit for fibromyalgia/anxiety.

## 2014-06-24 ENCOUNTER — Encounter: Payer: Self-pay | Admitting: Family Medicine

## 2014-06-24 ENCOUNTER — Ambulatory Visit (INDEPENDENT_AMBULATORY_CARE_PROVIDER_SITE_OTHER): Payer: Medicare Other | Admitting: Family Medicine

## 2014-06-24 ENCOUNTER — Telehealth: Payer: Self-pay | Admitting: Family Medicine

## 2014-06-24 VITALS — BP 129/87 | Temp 98.1°F | Ht 61.0 in | Wt 165.6 lb

## 2014-06-24 DIAGNOSIS — Z23 Encounter for immunization: Secondary | ICD-10-CM

## 2014-06-24 DIAGNOSIS — R079 Chest pain, unspecified: Secondary | ICD-10-CM | POA: Diagnosis not present

## 2014-06-24 DIAGNOSIS — R0781 Pleurodynia: Secondary | ICD-10-CM | POA: Insufficient documentation

## 2014-06-24 NOTE — Patient Instructions (Addendum)
Side pain - continue Mobic daily (for pain and inflammation), stop Norco (narcotic pain medication), apply ice or heat (whatever makes the area feel better).  Make an appointment for your yearly physical in the next 2-4 weeks.   Please go to [facility] and set up an initial evaluation as soon as possible.

## 2014-06-24 NOTE — Assessment & Plan Note (Signed)
Patient presents for follow up of left rib pain s/p fall. Pain improved with Mobic and Norco. Xrays negative for acute fracture. -counseled patient that pain may persist for days to weeks -continue daily Mobic -discontinue Norco -apply heat/ice as needed

## 2014-06-24 NOTE — Progress Notes (Signed)
   Subjective:    Patient ID: Jenna Burke, female    DOB: 12/20/1960, 53 y.o.   MRN: [id number]  HPI 53 y/o female presents for follow up of left side pain after fall.  Seen and evaluated on 9/17 after fall at home, please see note from 9/17 for details on fall, she had xrays which her negative for acute fracture, she reports bruising at the time of injury which is still present but improved, taking Mobic daily, also took Norco prn which helped pain however is currently out of this medication, no sob, no cough, no chest pain   Review of Systems  Constitutional: Negative for fever, chills and fatigue.  Respiratory: Negative for cough and shortness of breath.   Cardiovascular: Negative for chest pain.       Objective:   Physical Exam Vitals: reviewed Gen: pleasant female, NAD MSK: point tenderness over left lateral ribs (T10 area), bruising present Resp: CTAB, normal effort Cardiac: RRR, S1 and S2 present, no murmurs, no heaves/thrills Skin: mild bruising present  xrays negative for fracture     Assessment & Plan:  Please see problem specific assessment and plan.

## 2014-06-24 NOTE — Telephone Encounter (Signed)
Pt seen in office today, seems confused about outcome of visit, pt not sure why other testing wasn't done and why she would be hurting in her chest. Advised pt nurse would call back to discuss.

## 2014-06-24 NOTE — Telephone Encounter (Signed)
Pt was able to talk with dr Ree Kida on the phone. [place], [place] CMA

## 2014-07-10 ENCOUNTER — Ambulatory Visit: Payer: Medicare Other | Admitting: Family Medicine

## 2014-07-10 DIAGNOSIS — F251 Schizoaffective disorder, depressive type: Secondary | ICD-10-CM | POA: Diagnosis not present

## 2014-07-10 DIAGNOSIS — F4310 Post-traumatic stress disorder, unspecified: Secondary | ICD-10-CM | POA: Diagnosis not present

## 2014-07-11 ENCOUNTER — Telehealth: Payer: Self-pay | Admitting: Family Medicine

## 2014-07-11 DIAGNOSIS — F419 Anxiety disorder, unspecified: Secondary | ICD-10-CM

## 2014-07-11 NOTE — Telephone Encounter (Signed)
Pt will need refill on Xanax by 10/26 but could not get in to see Dr. Ree Kida until 11/2. Requesting meds to last until appt. Pls advise.

## 2014-07-12 ENCOUNTER — Telehealth: Payer: Self-pay | Admitting: Family Medicine

## 2014-07-12 DIAGNOSIS — F419 Anxiety disorder, unspecified: Secondary | ICD-10-CM

## 2014-07-12 NOTE — Telephone Encounter (Signed)
Pt called and wanted Dr. Ree Kida to know that she did go to [facility] and met with them. They problem is that they do not prescribe Xanax. So she is unsure what you want her to do now. She also wanted the doctor to know that she will be needing a refill on her medication since she will not be able to get it from [place]. jw

## 2014-07-15 MED ORDER — ALPRAZOLAM 2 MG PO TABS: 2.0000 mg | ORAL_TABLET | Freq: Three times a day (TID) | ORAL | Status: DC | PRN

## 2014-07-15 NOTE — Telephone Encounter (Signed)
Prescription refilled, will discuss with patient at upcoming visit.

## 2014-07-15 NOTE — Telephone Encounter (Signed)
Note to nursing staff - please call patient to let her know that prescription for xanax is up front.

## 2014-07-16 NOTE — Telephone Encounter (Signed)
Left message on voicemail informing that rx is ready to be picked up.

## 2014-07-18 ENCOUNTER — Encounter: Payer: Self-pay | Admitting: Family Medicine

## 2014-07-18 ENCOUNTER — Ambulatory Visit (INDEPENDENT_AMBULATORY_CARE_PROVIDER_SITE_OTHER): Payer: Medicare Other | Admitting: Family Medicine

## 2014-07-18 VITALS — BP 155/95 | HR 88 | Temp 98.8°F | Ht 61.0 in | Wt 160.3 lb

## 2014-07-18 DIAGNOSIS — J069 Acute upper respiratory infection, unspecified: Secondary | ICD-10-CM | POA: Diagnosis not present

## 2014-07-18 DIAGNOSIS — B9789 Other viral agents as the cause of diseases classified elsewhere: Principal | ICD-10-CM

## 2014-07-18 MED ORDER — KETOROLAC TROMETHAMINE 60 MG/2ML IM SOLN
60.0000 mg | Freq: Once | INTRAMUSCULAR | Status: AC
  Administered 2014-07-18: 60 mg via INTRAMUSCULAR

## 2014-07-18 NOTE — Assessment & Plan Note (Signed)
Several symptoms consistent with viral infection Headache seems to be tension type and likely secondary to congestion Encouraged nasal saline, continue Flonase Treat with Toradol today and schedule aleve for the next 3 days Discussed reasons to return for care including worsening of symptoms, development of dyspnea, or chest pain. Followup with PCP as planned

## 2014-07-18 NOTE — Patient Instructions (Signed)
Great to meet you!  I think you have a cold, caused by a virus. If your symptoms worsen or you develop shortness of breath please come in for re-evaluation.

## 2014-07-18 NOTE — Addendum Note (Signed)
Addended by: Levert Feinstein F on: 07/18/2014 01:33 PM   Modules accepted: Orders

## 2014-07-18 NOTE — Progress Notes (Signed)
Patient ID: Jenna Burke, female   DOB: [birth year], 53 y.o.   MRN: [id number]   HPI  Patient presents today for headache and bodyaches  Patient states that she began this current illness with a headache that started about 4 days ago. She describes the headache as a bandlike distribution worse in her forehead. She states that she has congestion, ear pain, body aches, and malaise that have continued and worsened over the last 2 or 3 days.   She states that she gets some nausea after eating but is tolerating oral fluids easily  She's not had much relief with Tylenol or Aleve She states that she has circumferential rib pain around her back with deep inspiration and cough  She denies dyspnea.  Smoking status noted ROS: Per HPI  Objective: BP 155/95  Pulse 88  Temp(Src) 98.8 F (37.1 C) (Oral)  Ht 5\' 1"  (1.549 m)  Wt 160 lb 4.8 oz (72.712 kg)  BMI 30.30 kg/m2 Gen: NAD, alert, cooperative with exam HEENT: NCAT, TMs normal bilaterally, turbinates swollen bilaterally, no pharyngeal erythema CV: RRR, good S1/S2, no murmur Resp: CTABL, no wheezes, non-labored Ext: No edema, warm Neuro: Alert and oriented, No gross deficits  Assessment and plan:  Viral URI with cough Several symptoms consistent with viral infection Headache seems to be tension type and likely secondary to congestion Encouraged nasal saline, continue Flonase Treat with Toradol today and schedule aleve for the next 3 days Discussed reasons to return for care including worsening of symptoms, development of dyspnea, or chest pain. Followup with PCP as planned

## 2014-07-29 ENCOUNTER — Ambulatory Visit: Payer: Medicare Other | Admitting: Family Medicine

## 2014-07-30 ENCOUNTER — Other Ambulatory Visit: Payer: Self-pay | Admitting: Family Medicine

## 2014-07-31 NOTE — Telephone Encounter (Signed)
Note to nursing staff - please call in Soma 350 mg PO BID prn pain, dispense #60, refill #0

## 2014-08-14 ENCOUNTER — Other Ambulatory Visit: Payer: Self-pay | Admitting: Family Medicine

## 2014-08-15 ENCOUNTER — Telehealth: Payer: Self-pay | Admitting: Family Medicine

## 2014-08-15 DIAGNOSIS — F419 Anxiety disorder, unspecified: Secondary | ICD-10-CM

## 2014-08-15 NOTE — Telephone Encounter (Signed)
Pt called and needs a refill on her Xanax left up front. jw

## 2014-08-16 MED ORDER — ALPRAZOLAM 2 MG PO TABS: 2.0000 mg | ORAL_TABLET | Freq: Three times a day (TID) | ORAL | Status: DC | PRN

## 2014-08-16 NOTE — Telephone Encounter (Signed)
Spoke to patient - she did go and establish at [facility], according to patient Jenna Burke told her that they do not prescribe Xanax, she wants to attempt to go to the [facility], I confirmed with her that I thought that was a good idea, I will check with her to see if she has established at the [facility] prior to next refill of Xanax, prescription for xanax printed and placed up front.

## 2014-08-21 ENCOUNTER — Telehealth: Payer: Self-pay | Admitting: Family Medicine

## 2014-08-21 NOTE — Telephone Encounter (Signed)
Pt called and would like Dr. Ree Kida to call her when he gets back form vacation. She needs letter stating that she can not do jury duty. jw

## 2014-08-26 NOTE — Telephone Encounter (Signed)
Pt returning call, says she has a panic disorder, she doesn't like being around other people, stays home all the time and doesn't feel like she can go.

## 2014-08-26 NOTE — Telephone Encounter (Signed)
Attempted to contact patient to see why she feels that she can not serve on jury duty, did not leave message, will attempt to call again tomorrow.

## 2014-08-27 ENCOUNTER — Encounter: Payer: Self-pay | Admitting: Family Medicine

## 2014-08-27 ENCOUNTER — Other Ambulatory Visit: Payer: Self-pay | Admitting: Family Medicine

## 2014-08-27 NOTE — Telephone Encounter (Signed)
Prescription refilled and printed, placed in front office, decreased dose to SOMA 350 mg BID PRN, 60 tablets, no refills, continue to wean with subsequent refills

## 2014-08-27 NOTE — Telephone Encounter (Signed)
Spoke to patient, she has been given 28 hours of community service for 'breaking and entering", she has agoraphobia and would like letter stating that she can not complete community service, I informed her that I can not get her out of community service however can write a letter stating that she has agoraphobia and to let her serve in a position where she can work by herself. Will complete letter and leave in front office.

## 2014-09-02 ENCOUNTER — Encounter: Payer: Self-pay | Admitting: Family Medicine

## 2014-09-02 ENCOUNTER — Ambulatory Visit (INDEPENDENT_AMBULATORY_CARE_PROVIDER_SITE_OTHER): Payer: Medicare Other | Admitting: Family Medicine

## 2014-09-02 VITALS — BP 114/88 | HR 108 | Temp 97.8°F | Ht 61.0 in | Wt 158.2 lb

## 2014-09-02 DIAGNOSIS — Z0000 Encounter for general adult medical examination without abnormal findings: Secondary | ICD-10-CM

## 2014-09-02 DIAGNOSIS — E669 Obesity, unspecified: Secondary | ICD-10-CM | POA: Diagnosis not present

## 2014-09-02 DIAGNOSIS — M797 Fibromyalgia: Secondary | ICD-10-CM

## 2014-09-02 DIAGNOSIS — F410 Panic disorder [episodic paroxysmal anxiety] without agoraphobia: Secondary | ICD-10-CM

## 2014-09-02 DIAGNOSIS — R030 Elevated blood-pressure reading, without diagnosis of hypertension: Secondary | ICD-10-CM

## 2014-09-02 DIAGNOSIS — L290 Pruritus ani: Secondary | ICD-10-CM | POA: Diagnosis not present

## 2014-09-02 DIAGNOSIS — Z136 Encounter for screening for cardiovascular disorders: Secondary | ICD-10-CM

## 2014-09-02 LAB — CBC
HCT: 37.7 % (ref 36.0–46.0)
Hemoglobin: 13.1 g/dL (ref 12.0–15.0)
MCH: 28.7 pg (ref 26.0–34.0)
MCHC: 34.7 g/dL (ref 30.0–36.0)
MCV: 82.7 fL (ref 78.0–100.0)
MPV: 10.6 fL (ref 9.4–12.4)
PLATELETS: 325 10*3/uL (ref 150–400)
RBC: 4.56 MIL/uL (ref 3.87–5.11)
RDW: 14.4 % (ref 11.5–15.5)
WBC: 6.3 10*3/uL (ref 4.0–10.5)

## 2014-09-02 LAB — BASIC METABOLIC PANEL
BUN: 6 mg/dL (ref 6–23)
CO2: 29 mEq/L (ref 19–32)
CREATININE: 0.62 mg/dL (ref 0.50–1.10)
Calcium: 9.3 mg/dL (ref 8.4–10.5)
Chloride: 94 mEq/L — ABNORMAL LOW (ref 96–112)
Glucose, Bld: 88 mg/dL (ref 70–99)
POTASSIUM: 3.5 meq/L (ref 3.5–5.3)
Sodium: 131 mEq/L — ABNORMAL LOW (ref 135–145)

## 2014-09-02 LAB — LIPID PANEL
CHOLESTEROL: 242 mg/dL — AB (ref 0–200)
HDL: 65 mg/dL (ref >39)
LDL Cholesterol: 144 mg/dL — ABNORMAL HIGH (ref 0–99)
TRIGLYCERIDES: 167 mg/dL — AB (ref <150)
Total CHOL/HDL Ratio: 3.7 Ratio
VLDL: 33 mg/dL (ref 0–40)

## 2014-09-02 MED ORDER — TERBINAFINE HCL 1 % EX CREA: 1.0000 "application " | TOPICAL_CREAM | Freq: Two times a day (BID) | CUTANEOUS | Status: DC

## 2014-09-02 NOTE — Patient Instructions (Addendum)
Anal itching - I think that this is due to a fungal infection, start lamisil daily  Immunizations - up to date  Colonoscopy - up to date  Mammogram - due for mammogram, please schedule  Pap Smear - due in 2017  Anxiety - please call Dr. Ree Kida after you [facility]  Fibromyalgia - start taking Gabapentin daily  Labs - Dr. Ree Kida will call you with your results

## 2014-09-03 ENCOUNTER — Telehealth: Payer: Self-pay | Admitting: Family Medicine

## 2014-09-03 DIAGNOSIS — Z0000 Encounter for general adult medical examination without abnormal findings: Secondary | ICD-10-CM | POA: Insufficient documentation

## 2014-09-03 NOTE — Assessment & Plan Note (Signed)
Repeated rectal exam which was negative, no blood in stool, irritation in gluteal cleft which may be consistent with fungal infection -attempt trial of topical terbinifine

## 2014-09-03 NOTE — Assessment & Plan Note (Signed)
Patient seen for preventative health care. -up to date on immunizations -Due for mammogram (patient to schedule) -Due for colonoscopy in 2016 -Up to date on Pap smear -Discussed lifestyle modifications

## 2014-09-03 NOTE — Assessment & Plan Note (Signed)
Patient continues to have anxiety/panic attacks, appears stable on Anafrinil and Xanax -recently seen by [facility] (told that they would not prescribe Xanax) -has appointment with [facility] on 12/8 -goal is to transition behavioral health care to [facility]

## 2014-09-03 NOTE — Telephone Encounter (Signed)
Attempted to contact, no answer. Will attempt to contact again.

## 2014-09-03 NOTE — Progress Notes (Addendum)
53 y.o. year old female presents for well woman/preventative visit.  Acute Concerns: 1. Anxiety/Agoraphobia - recently went to [facility], was told that they do not prescribe Xanax, has appointment [facility], patient aware that it is preferable to have her behavior health medications prescribed by [facility] 2. Anal itching - patient reports continued anal itching, no blood in stool, previous study to evaluate for pinworms negative 3. Fibromyalgia - weaning SOMA, currently on BID dosing, has not been taking Gabapentin, pain mostly in legs  Exercise: Does not exercise regularly  Sexual/Birth History: No children, no current sexual activity  Birth Control: None  POA/Living Will: None  Social:  History   Social History  . Marital Status: Single    Spouse Name: N/A    Number of Children: 0  . Years of Education: 7   Occupational History  . DISABLED    Social History Main Topics  . Smoking status: Former Smoker    Quit date: 12/15/2010  . Smokeless tobacco: Never Used  . Alcohol Use: No  . Drug Use: No  . Sexual Activity: None   Other Topics Concern  . None   Social History Narrative   Single, lives with friend, Lasandra Beech   Quit school age 32,7th grade, poor literacy   Disabled 1991, physical and emotional problems    Immunization:  Tdap/TD: 2012  Influenza:2015  Pneumococcal: Not a candidate  Herpes Zoster: Not a candidate  Cancer Screening:  Pap Smear: 12/2012  Mammogram: 2014  Colonoscopy: 2006 (F/U 10 years)  Dexa: None  Physical Exam: VITALS: reviewed GEN: pleasant Caucasian female, accompanied by friend Clarise Cruz, low health literacy HEENT: normocephalic, PERRL, EOMI, no scleral icterus, nasal septum midline, no rhinorrhea, MMM, uvula midline, neck supple, no adenopathy, no thyromegaly CARDIAC: RRR, S1 and S2 present, no murmurs, no heaves/thrills RESP: CTAB, normal effort BREAST: Deferred  ABD: soft, no tenderness, normal bowel  sounds GU/GYN:Deferred  EXT: no edema, 2+ radial pulses bilatearally SKIN: multiple small cherry hemangiomas of skin, no suspicious skin lesions noted Rectal - no hemorrhoids, fungal infection of gluteal cleft present, no blood, hemoccult negative  ASSESSMENT & PLAN: 53 y.o. female presents for annual well woman/preventative exam and GYN exam. Please see problem specific assessment and plan.

## 2014-09-03 NOTE — Assessment & Plan Note (Signed)
Patient continues to have daily pain, mostly of legs and low back -Weaning Soma, down to BID dosing -Patient counseled to restart Gabapentin 100 mg TID

## 2014-09-04 ENCOUNTER — Telehealth: Payer: Self-pay | Admitting: Family Medicine

## 2014-09-04 DIAGNOSIS — E871 Hypo-osmolality and hyponatremia: Secondary | ICD-10-CM | POA: Insufficient documentation

## 2014-09-04 DIAGNOSIS — E785 Hyperlipidemia, unspecified: Secondary | ICD-10-CM | POA: Insufficient documentation

## 2014-09-04 MED ORDER — SIMVASTATIN 40 MG PO TABS: 40.0000 mg | ORAL_TABLET | Freq: Every day | ORAL | Status: DC

## 2014-09-04 NOTE — Telephone Encounter (Signed)
Discuss lab results with patient.

## 2014-09-04 NOTE — Assessment & Plan Note (Signed)
Na 131 on recent BMP.  -recheck in 2 weeks

## 2014-09-04 NOTE — Assessment & Plan Note (Signed)
Initiated therapy with Simvastatin.

## 2014-09-09 ENCOUNTER — Telehealth: Payer: Self-pay | Admitting: Family Medicine

## 2014-09-09 NOTE — Telephone Encounter (Signed)
Patient informed that her last tdap was in 2013 but that she should still make an appointment to be evaluated for her dog bite, she states she will call back to schedule an appointment.

## 2014-09-09 NOTE — Telephone Encounter (Signed)
error 

## 2014-09-09 NOTE — Telephone Encounter (Signed)
Pt was attacked on her hand by a dog, offered appt, pt declined, wants to know when her last tetanus shot was?

## 2014-09-16 ENCOUNTER — Telehealth: Payer: Self-pay | Admitting: Family Medicine

## 2014-09-16 DIAGNOSIS — F419 Anxiety disorder, unspecified: Secondary | ICD-10-CM

## 2014-09-16 MED ORDER — ALPRAZOLAM 2 MG PO TABS: 2.0000 mg | ORAL_TABLET | Freq: Three times a day (TID) | ORAL | Status: DC | PRN

## 2014-09-16 NOTE — Telephone Encounter (Signed)
I am covering for Dr. Ree Kida.  Asha, please call in prescription for Xanax 2 mg, 1 tab po TID PRN # 90 no refills, brand name medically necessary.  I have placed an order for this.   Thanks, JW

## 2014-09-16 NOTE — Telephone Encounter (Signed)
Pt called and needs a refill on her Xanax. It must say medically necessary on the prescription. She is due for a refill on the 23rd and just wanted to make sure since we are closing early this week that we have time to write the prescription. jw

## 2014-09-16 NOTE — Telephone Encounter (Signed)
Rx called in, patient informed 

## 2014-10-03 ENCOUNTER — Other Ambulatory Visit: Payer: Self-pay | Admitting: Family Medicine

## 2014-10-03 NOTE — Telephone Encounter (Signed)
Note to nursing staff - please call in soma 350 mg, 1 tablet BID PRN, dispense #45, refill 0; please also call patient and make her aware that prescription sent to pharmacy, weaning medication therefore only 45 tablets called in, must last 30 days, take 1-2 tablets daily

## 2014-10-03 NOTE — Telephone Encounter (Signed)
Rx called in, lmovm for patient to call clinic

## 2014-10-16 ENCOUNTER — Telehealth: Payer: Self-pay | Admitting: Family Medicine

## 2014-10-16 ENCOUNTER — Other Ambulatory Visit: Payer: Self-pay | Admitting: Family Medicine

## 2014-10-16 DIAGNOSIS — F419 Anxiety disorder, unspecified: Secondary | ICD-10-CM

## 2014-10-16 MED ORDER — ALPRAZOLAM 2 MG PO TABS: 2.0000 mg | ORAL_TABLET | Freq: Three times a day (TID) | ORAL | Status: DC | PRN

## 2014-10-16 NOTE — Telephone Encounter (Signed)
Refill provided to patient's sister.

## 2014-10-16 NOTE — Telephone Encounter (Signed)
Refill needed today for her Xanax.  Mother have an appt today at [facility] and will pick up for her.

## 2014-11-04 ENCOUNTER — Other Ambulatory Visit: Payer: Self-pay | Admitting: Family Medicine

## 2014-11-04 NOTE — Telephone Encounter (Signed)
Note to nursing staff - please call in Soma 325 mg PO once daily prn muscle spasm, dispense #30, refill #0

## 2014-11-05 ENCOUNTER — Other Ambulatory Visit: Payer: Self-pay | Admitting: Family Medicine

## 2014-11-06 NOTE — Telephone Encounter (Signed)
RX called in by physician

## 2014-11-14 ENCOUNTER — Other Ambulatory Visit: Payer: Self-pay | Admitting: Family Medicine

## 2014-11-14 DIAGNOSIS — F419 Anxiety disorder, unspecified: Secondary | ICD-10-CM

## 2014-11-14 NOTE — Telephone Encounter (Signed)
Pt called and needs a refill on her Xanax. This must say Medically Necessary. The prescription is due on Saturday. Jw

## 2014-11-15 MED ORDER — ALPRAZOLAM 2 MG PO TABS: 2.0000 mg | ORAL_TABLET | Freq: Three times a day (TID) | ORAL | Status: DC | PRN

## 2014-11-15 NOTE — Telephone Encounter (Signed)
[facility] [address]

## 2014-11-15 NOTE — Telephone Encounter (Signed)
Note to nursing staff - please call in Xanax 2 mg TID prn, please state medically necessary, dispense #90, refill @0 , please call patient when this is call in as well, thanks

## 2014-12-03 ENCOUNTER — Ambulatory Visit: Payer: Medicare Other | Admitting: Family Medicine

## 2014-12-04 ENCOUNTER — Other Ambulatory Visit: Payer: Self-pay | Admitting: Family Medicine

## 2014-12-05 NOTE — Telephone Encounter (Signed)
Note to nurse - please call in SOMA 350 mg one tablet daily prn pain, dispense #15, refill #0, thanks

## 2014-12-05 NOTE — Telephone Encounter (Signed)
Rx called in 

## 2014-12-10 ENCOUNTER — Ambulatory Visit: Payer: Medicare Other | Admitting: Family Medicine

## 2014-12-13 ENCOUNTER — Other Ambulatory Visit: Payer: Self-pay | Admitting: Family Medicine

## 2014-12-13 DIAGNOSIS — F419 Anxiety disorder, unspecified: Secondary | ICD-10-CM

## 2014-12-13 MED ORDER — ALPRAZOLAM 2 MG PO TABS: 2.0000 mg | ORAL_TABLET | Freq: Three times a day (TID) | ORAL | Status: DC | PRN

## 2014-12-13 NOTE — Telephone Encounter (Signed)
Rx called in to pharmacy. 

## 2014-12-13 NOTE — Telephone Encounter (Signed)
Nursing staff - please call in Xanax 2 mg TID prn, dispense #90, refill #0, thanks

## 2014-12-13 NOTE — Telephone Encounter (Signed)
Pt called and needs a refill on her Xanax. She would like to come in this afternoon to pick up. Don't forget that it must say brand name and medically necessary on the prescription. jw

## 2014-12-31 ENCOUNTER — Ambulatory Visit: Payer: Medicare Other | Admitting: Family Medicine

## 2014-12-31 ENCOUNTER — Other Ambulatory Visit: Payer: Self-pay | Admitting: Family Medicine

## 2015-01-01 NOTE — Telephone Encounter (Signed)
RN staff - please call in Soma 350 mg one tablet daily prn pain, dispense #15, refill #0; please also call patient and notify her that prescription has been called in

## 2015-01-02 ENCOUNTER — Other Ambulatory Visit: Payer: Self-pay | Admitting: Family Medicine

## 2015-01-02 NOTE — Telephone Encounter (Signed)
Rx called in, patient informed 

## 2015-01-07 ENCOUNTER — Ambulatory Visit: Payer: Medicare Other | Admitting: Family Medicine

## 2015-01-13 ENCOUNTER — Other Ambulatory Visit: Payer: Self-pay | Admitting: Family Medicine

## 2015-01-13 DIAGNOSIS — F419 Anxiety disorder, unspecified: Secondary | ICD-10-CM

## 2015-01-13 NOTE — Telephone Encounter (Signed)
Pt need refill on Xanax. Please call pt when ready / thanks Fonda Kinder, ASA

## 2015-01-14 MED ORDER — ALPRAZOLAM 2 MG PO TABS: 2.0000 mg | ORAL_TABLET | Freq: Three times a day (TID) | ORAL | Status: DC | PRN

## 2015-01-14 NOTE — Telephone Encounter (Signed)
Nursing staff - please call in Xanax 2 mg TID prn, dispense #90, refill #0, state Brand Name medically necessary. Please call patient to inform her that this has been sent to pharmacy, please also schedule her to see me in office, thanks

## 2015-01-15 NOTE — Telephone Encounter (Signed)
Patient informed that rx was called in, she states she will call back and scheduled an appointment.

## 2015-02-04 ENCOUNTER — Other Ambulatory Visit: Payer: Self-pay | Admitting: Family Medicine

## 2015-02-05 NOTE — Telephone Encounter (Signed)
Rx called in 

## 2015-02-05 NOTE — Telephone Encounter (Signed)
Nursing staff - please call in SOMA 350 mg, one tablet daily as needed, dispense #15, refill #0

## 2015-02-18 ENCOUNTER — Ambulatory Visit: Payer: Medicare Other | Admitting: Family Medicine

## 2015-02-18 ENCOUNTER — Other Ambulatory Visit: Payer: Self-pay | Admitting: Family Medicine

## 2015-02-18 DIAGNOSIS — F419 Anxiety disorder, unspecified: Secondary | ICD-10-CM

## 2015-02-18 NOTE — Telephone Encounter (Signed)
Pt called reschedule her appointment for today because she doesn't have a ride. She does need a refill on her Xanax and it must say medically necessary to be filled. jw

## 2015-02-19 ENCOUNTER — Other Ambulatory Visit: Payer: Self-pay | Admitting: Family Medicine

## 2015-02-19 MED ORDER — ALPRAZOLAM 2 MG PO TABS: 2.0000 mg | ORAL_TABLET | Freq: Three times a day (TID) | ORAL | Status: DC | PRN

## 2015-02-19 NOTE — Telephone Encounter (Signed)
Rx called into pharmacy. Tried calling patient, number temporarily disconnected.

## 2015-02-19 NOTE — Telephone Encounter (Signed)
Note to nursing staff - please call in Xanax 2 mg TID prn anxiety, dispense #90, no refills; please call patient to let her know that the prescription was sent to the pharmacy, please schedule her another appointment with me (she will need to be seen prior to her next refill or I will not fill the prescription), thanks

## 2015-02-25 ENCOUNTER — Encounter: Payer: Self-pay | Admitting: Family Medicine

## 2015-02-25 ENCOUNTER — Ambulatory Visit: Payer: Medicare Other | Admitting: Family Medicine

## 2015-02-25 ENCOUNTER — Ambulatory Visit (INDEPENDENT_AMBULATORY_CARE_PROVIDER_SITE_OTHER): Payer: Medicare Other | Admitting: Family Medicine

## 2015-02-25 VITALS — BP 118/80 | HR 108 | Temp 98.0°F | Ht 61.0 in | Wt 162.6 lb

## 2015-02-25 DIAGNOSIS — F410 Panic disorder [episodic paroxysmal anxiety] without agoraphobia: Secondary | ICD-10-CM

## 2015-02-25 DIAGNOSIS — M797 Fibromyalgia: Secondary | ICD-10-CM | POA: Diagnosis present

## 2015-02-25 DIAGNOSIS — F42 Obsessive-compulsive disorder: Secondary | ICD-10-CM | POA: Diagnosis not present

## 2015-02-25 DIAGNOSIS — N951 Menopausal and female climacteric states: Secondary | ICD-10-CM

## 2015-02-25 DIAGNOSIS — F429 Obsessive-compulsive disorder, unspecified: Secondary | ICD-10-CM

## 2015-02-25 MED ORDER — GABAPENTIN 100 MG PO CAPS: 100.0000 mg | ORAL_CAPSULE | Freq: Three times a day (TID) | ORAL | Status: DC

## 2015-02-25 NOTE — Assessment & Plan Note (Signed)
Stable on Xanax -[facility] -will wean Xanax after she establishes and begins CBT

## 2015-02-25 NOTE — Progress Notes (Signed)
   Subjective:    Patient ID: Jenna Burke, female    DOB: 04/06/1961, 54 y.o.   MRN: [id number]  HPI 54 y/o female presents for routine follow up.  Depression/Anxiety/OCD - patient has [facility], had initial intake a few months ago, has missed a few follow up appointments as she has court mandated counseling at another facility in [place] (see by a Awilda Bill), patient states that she attempted to switch out a pair of pants at [employer] without seeing customer service, she was told never to return however she returned at a later date to use the restroom and has since been prosecuted, currently taking Anafranil and Xanax  Fibromyalgia - reports continued low back and leg pain, Soma helps however "makes her feel funny", we have been slowly weaning this medication, previously started on Gabapentin however has not started, reluctant to start due to possible side effects  Hot Flashes - reports daily hot flashes, last period was over 12 months ago  Social - former smoker  Review of Systems  Constitutional: Negative for fever, chills and fatigue.  Respiratory: Negative for cough and shortness of breath.   Cardiovascular: Negative for chest pain.  Gastrointestinal: Negative for nausea, vomiting and diarrhea.       Objective:   Physical Exam Vitals: reviewed Gen: pleasant female, NAD Cardiac: RRR, S1 and S2 present, no murmur, no heaves/thrills Resp: CTAB, normal effort Psych: appears anxious at time, pressured speech, dressed appropriately and wearing makeup, does not appear to be responding to internal stimuli, no SI  PHQ score of 8, answer to last question is "very difficult", see scanned copy     Assessment & Plan:  Please see problem specific assessment and plan.

## 2015-02-25 NOTE — Patient Instructions (Addendum)
It was nice to see you again.   Fibromyalgia - stop Soma, start Gabapentin 100 mg three times per day  Hot Flashes - may improve with the Gabapentin  Cholesterol - start Simvastatin  Anxiety/OCD - please keep your appointment with [facility], continue Anafranil and Xanax  Return in one month

## 2015-02-25 NOTE — Assessment & Plan Note (Signed)
Patient is having postmenopausal hot flashes. Patient not interested in hormonal therapy -attempt trial of Gabapentin

## 2015-02-25 NOTE — Assessment & Plan Note (Signed)
Stable on Anafrinil -patient has upcoming follow up at the [facility]

## 2015-02-25 NOTE — Assessment & Plan Note (Signed)
Stable -will stop Soma today -restarted Gabapentin 100 mg TID (has not been taking)

## 2015-03-03 ENCOUNTER — Other Ambulatory Visit: Payer: Self-pay | Admitting: Family Medicine

## 2015-03-07 ENCOUNTER — Other Ambulatory Visit: Payer: Self-pay | Admitting: Family Medicine

## 2015-03-07 DIAGNOSIS — F42 Obsessive-compulsive disorder: Secondary | ICD-10-CM | POA: Diagnosis not present

## 2015-03-17 DIAGNOSIS — F42 Obsessive-compulsive disorder: Secondary | ICD-10-CM | POA: Diagnosis not present

## 2015-03-19 ENCOUNTER — Telehealth: Payer: Self-pay | Admitting: Family Medicine

## 2015-03-19 DIAGNOSIS — F419 Anxiety disorder, unspecified: Secondary | ICD-10-CM

## 2015-03-19 MED ORDER — ALPRAZOLAM 2 MG PO TABS: 2.0000 mg | ORAL_TABLET | Freq: Three times a day (TID) | ORAL | Status: DC | PRN

## 2015-03-19 NOTE — Telephone Encounter (Signed)
Nursing staff - please call in Xanax 2 mg TID prn anxiety, start date 03/22/15. Brand name medically necessary. No refills. Dispense #90

## 2015-03-19 NOTE — Telephone Encounter (Signed)
Pt called because she will be out of her medication on Sunday and wanted to call this in early enough for the doctor to have a chance to write it. Also she has been going to the Center that the doctor told her to go to. She has been there twice now. jw

## 2015-03-20 NOTE — Telephone Encounter (Signed)
Rx called in 

## 2015-03-25 DIAGNOSIS — F42 Obsessive-compulsive disorder: Secondary | ICD-10-CM | POA: Diagnosis not present

## 2015-03-30 ENCOUNTER — Other Ambulatory Visit: Payer: Self-pay | Admitting: Family Medicine

## 2015-04-02 ENCOUNTER — Other Ambulatory Visit: Payer: Self-pay | Admitting: Family Medicine

## 2015-04-03 NOTE — Telephone Encounter (Signed)
Rx called into patient pharmacy. 

## 2015-04-03 NOTE — Telephone Encounter (Signed)
Nursing staff - please call in Soma 350 mg, dispense #15, one tablet daily prn, no refill  Please also notify patient that this will be the last refill of this medication. She can schedule an appointment with me if she wishes to discuss this in more detail.

## 2015-04-17 ENCOUNTER — Other Ambulatory Visit: Payer: Self-pay | Admitting: Family Medicine

## 2015-04-17 DIAGNOSIS — F419 Anxiety disorder, unspecified: Secondary | ICD-10-CM

## 2015-04-17 NOTE — Telephone Encounter (Signed)
Pt called because she needs a refill on her Xanax called in and also to remember to say medically neccessary and must be brand name. jw

## 2015-04-18 MED ORDER — ALPRAZOLAM 2 MG PO TABS: 2.0000 mg | ORAL_TABLET | Freq: Three times a day (TID) | ORAL | Status: DC | PRN

## 2015-04-18 NOTE — Telephone Encounter (Signed)
rx called in. Dr Ree Kida also talked to them as well. Lexi Conaty Kennon Holter, CMA

## 2015-04-18 NOTE — Telephone Encounter (Signed)
RN - please call in Xanax 2 mg TID prn anxiety, dispense #90, refill #0, please also schedule follow up for anxiety. Thanks.

## 2015-05-01 ENCOUNTER — Other Ambulatory Visit: Payer: Self-pay | Admitting: Family Medicine

## 2015-05-15 ENCOUNTER — Other Ambulatory Visit: Payer: Self-pay | Admitting: Family Medicine

## 2015-05-15 DIAGNOSIS — F419 Anxiety disorder, unspecified: Secondary | ICD-10-CM

## 2015-05-15 NOTE — Telephone Encounter (Signed)
Pt called because she needs a refill on her Xanax. Make sure to say brand name and medically necessary. Please inform patient when this is done. jw

## 2015-05-16 MED ORDER — ALPRAZOLAM 2 MG PO TABS: 2.0000 mg | ORAL_TABLET | Freq: Three times a day (TID) | ORAL | Status: DC | PRN

## 2015-05-16 NOTE — Telephone Encounter (Signed)
RN Staff - please call in Xanax 2 mg TID prn Brand Name Necessary, dispense #90, refill #0, please also schedule follow up appointment, thanks.

## 2015-05-16 NOTE — Telephone Encounter (Signed)
Rx called in. Deseree Kennon Holter, CMA

## 2015-05-20 DIAGNOSIS — F42 Obsessive-compulsive disorder: Secondary | ICD-10-CM | POA: Diagnosis not present

## 2015-06-13 ENCOUNTER — Other Ambulatory Visit: Payer: Self-pay | Admitting: Family Medicine

## 2015-06-13 DIAGNOSIS — F419 Anxiety disorder, unspecified: Secondary | ICD-10-CM

## 2015-06-13 MED ORDER — ALPRAZOLAM 2 MG PO TABS: 2.0000 mg | ORAL_TABLET | Freq: Three times a day (TID) | ORAL | Status: DC | PRN

## 2015-06-13 NOTE — Telephone Encounter (Signed)
Pt called because she needs a refill on her Xanax. Brand name and medically necessary. Can we do this today since she can someone who will in the area this afternoon. She also needs to speak to Dr. Ree Kida about her falling and hurting her knee. jw

## 2015-06-13 NOTE — Telephone Encounter (Signed)
RN staff - please call in Xanax 1 mg TID prn, dispense #90, refill #90, thanks  Patient was going down ramp at friend's home yesterday, stepped thought a rotten board, now had leg pain, no relief with tylenol, counseled to attempt trail of NSAID's, if no improvement patient will make an appointment

## 2015-06-16 ENCOUNTER — Other Ambulatory Visit: Payer: Self-pay | Admitting: Family Medicine

## 2015-06-16 NOTE — Telephone Encounter (Signed)
RN staff - please call pharmacy and confirm receipt of Xanax, thanks

## 2015-06-16 NOTE — Telephone Encounter (Signed)
[facility] 

## 2015-06-16 NOTE — Telephone Encounter (Signed)
Pt is calling to check the status of her refill. Epic shows that we called this in on 06/13/15 but the pharmacy said it is not there. Can we call them again. jw

## 2015-06-19 ENCOUNTER — Emergency Department (HOSPITAL_COMMUNITY): Payer: Medicare Other

## 2015-06-19 ENCOUNTER — Emergency Department (HOSPITAL_COMMUNITY)
Admission: EM | Admit: 2015-06-19 | Discharge: 2015-06-19 | Disposition: A | Discharge status: Home / Self Care | Payer: Medicare Other | Attending: Emergency Medicine | Admitting: Emergency Medicine

## 2015-06-19 ENCOUNTER — Encounter (HOSPITAL_COMMUNITY): Payer: Self-pay | Admitting: Emergency Medicine

## 2015-06-19 DIAGNOSIS — K219 Gastro-esophageal reflux disease without esophagitis: Secondary | ICD-10-CM | POA: Insufficient documentation

## 2015-06-19 DIAGNOSIS — S299XXA Unspecified injury of thorax, initial encounter: Secondary | ICD-10-CM | POA: Diagnosis not present

## 2015-06-19 DIAGNOSIS — Y998 Other external cause status: Secondary | ICD-10-CM | POA: Diagnosis not present

## 2015-06-19 DIAGNOSIS — Y9289 Other specified places as the place of occurrence of the external cause: Secondary | ICD-10-CM | POA: Insufficient documentation

## 2015-06-19 DIAGNOSIS — R262 Difficulty in walking, not elsewhere classified: Secondary | ICD-10-CM | POA: Diagnosis not present

## 2015-06-19 DIAGNOSIS — W1839XA Other fall on same level, initial encounter: Secondary | ICD-10-CM | POA: Insufficient documentation

## 2015-06-19 DIAGNOSIS — S3992XA Unspecified injury of lower back, initial encounter: Secondary | ICD-10-CM | POA: Insufficient documentation

## 2015-06-19 DIAGNOSIS — M7918 Myalgia, other site: Secondary | ICD-10-CM

## 2015-06-19 DIAGNOSIS — Z87891 Personal history of nicotine dependence: Secondary | ICD-10-CM | POA: Diagnosis not present

## 2015-06-19 DIAGNOSIS — Y9301 Activity, walking, marching and hiking: Secondary | ICD-10-CM | POA: Insufficient documentation

## 2015-06-19 DIAGNOSIS — W19XXXA Unspecified fall, initial encounter: Secondary | ICD-10-CM

## 2015-06-19 DIAGNOSIS — M546 Pain in thoracic spine: Secondary | ICD-10-CM | POA: Diagnosis not present

## 2015-06-19 DIAGNOSIS — Z043 Encounter for examination and observation following other accident: Secondary | ICD-10-CM | POA: Diagnosis not present

## 2015-06-19 DIAGNOSIS — Z8781 Personal history of (healed) traumatic fracture: Secondary | ICD-10-CM | POA: Insufficient documentation

## 2015-06-19 DIAGNOSIS — Z79899 Other long term (current) drug therapy: Secondary | ICD-10-CM | POA: Insufficient documentation

## 2015-06-19 DIAGNOSIS — M791 Myalgia: Secondary | ICD-10-CM | POA: Diagnosis not present

## 2015-06-19 DIAGNOSIS — R51 Headache: Secondary | ICD-10-CM | POA: Diagnosis not present

## 2015-06-19 DIAGNOSIS — M6281 Muscle weakness (generalized): Secondary | ICD-10-CM | POA: Diagnosis not present

## 2015-06-19 DIAGNOSIS — S0990XA Unspecified injury of head, initial encounter: Secondary | ICD-10-CM | POA: Insufficient documentation

## 2015-06-19 LAB — I-STAT CHEM 8, ED
BUN: 5 mg/dL — AB (ref 6–20)
CALCIUM ION: 1.17 mmol/L (ref 1.12–1.23)
CHLORIDE: 99 mmol/L — AB (ref 101–111)
Creatinine, Ser: 0.6 mg/dL (ref 0.44–1.00)
Glucose, Bld: 82 mg/dL (ref 65–99)
HEMATOCRIT: 37 % (ref 36.0–46.0)
Hemoglobin: 12.6 g/dL (ref 12.0–15.0)
POTASSIUM: 3.5 mmol/L (ref 3.5–5.1)
SODIUM: 139 mmol/L (ref 135–145)
TCO2: 29 mmol/L (ref 0–100)

## 2015-06-19 LAB — URINALYSIS, ROUTINE W REFLEX MICROSCOPIC
Bilirubin Urine: NEGATIVE
Glucose, UA: NEGATIVE mg/dL
HGB URINE DIPSTICK: NEGATIVE
Ketones, ur: NEGATIVE mg/dL
Leukocytes, UA: NEGATIVE
NITRITE: NEGATIVE
PROTEIN: NEGATIVE mg/dL
Specific Gravity, Urine: 1.001 — ABNORMAL LOW (ref 1.005–1.030)
Urobilinogen, UA: 0.2 mg/dL (ref 0.0–1.0)
pH: 6 (ref 5.0–8.0)

## 2015-06-19 MED ORDER — KETOROLAC TROMETHAMINE 60 MG/2ML IM SOLN
60.0000 mg | Freq: Once | INTRAMUSCULAR | Status: AC
  Administered 2015-06-19: 60 mg via INTRAMUSCULAR
  Filled 2015-06-19: qty 2

## 2015-06-19 NOTE — ED Notes (Signed)
Pt states was walking down wheelchair ramp at home and fell off approx 2 feet, landing on grass-- states has been unable to walk since. When asked how she gets around at home, pt states "I hold on to things and my sister helps me" states has pain from neck to tailbone and both legs.

## 2015-06-19 NOTE — ED Provider Notes (Signed)
CSN: [id number]     Arrival date & time 06/19/15  1106 History   First MD Initiated Contact with Patient 06/19/15 1215     Chief Complaint  Patient presents with  . Fall     (Consider location/radiation/quality/duration/timing/severity/associated sxs/prior Treatment) HPI Comments: Pt is a 54 yo female with PMH of GERD, anxiety and fibromyalgia who presents to the ED with complaint of pain s/p fall. Pt reports she was walking down a wheelchair ramp 1 week ago when her legs "gave out" resulting in her falling appx. 2 feet down ramp and landing in grass. Denies LOC. Endorses back pain, leg pain, HA and weakness. Denies fever, chills, cough, SOB, CP, abdominal pain, N/V/D, urinary sxs, numbness, tingling, visual changes. She reports being unable to walk since the fall and states she has been getting around her house by sitting in a computer chair with wheels. She reports hitting her head on the fall and since has had intermittent headache located to her right parietal region that has started to improve since the fall. Pt notes she has been taking ibuprofen at home without relief. Pt is not on blood thinners.  Patient is a 54 y.o. female presenting with fall.  Fall Associated symptoms include headaches, myalgias and weakness.    Past Medical History  Diagnosis Date  . Esophageal reflux disease 10/1999    EGD showed esophagitis  . Fracture of metatarsal bone of left foot 08/2003    3&4  . 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 Seizures    Past Surgical History  Procedure Laterality Date  . Biopsy endometrial  07/1991  . Biopsy endometrial  06/2004    secretory  . Perineal hidradenitis excision  12/2002    incision and drainage  . Colonoscopy  623/2006    normal  . Wrist surgery      Right   Family History  Problem Relation Age of Onset  . Cancer Mother     colon  . Heart attack Father   . OCD Father   . Osteoarthritis Maternal Grandmother   .  Cancer Maternal Grandmother     stomach   Social History  Substance Use Topics  . Smoking status: Former Smoker    Quit date: 12/15/2010  . Smokeless tobacco: Never Used  . Alcohol Use: No   OB History    No data available     Review of Systems  Musculoskeletal: Positive for myalgias and back pain.  Neurological: Positive for weakness and headaches.  All other systems reviewed and are negative.     Allergies  Caffeine; Paroxetine; and Tramadol  Home Medications   Prior to Admission medications   Medication Sig Start Date End Date Taking? Authorizing Provider  alprazolam Duanne Moron) 2 MG tablet Take 1 tablet (2 mg total) by mouth 3 (three) times daily as needed. For anxiety Brand name medically necessary 06/13/15   Lupita Dawn, MD  ANAFRANIL 50 MG capsule TAKE 2 CAPSULES BY MOUTH EVERY MORNING AND 3 CAPS AT BEDTIME 02/05/15   Lupita Dawn, MD  fluticasone [facility]) 50 MCG/ACT nasal spray Place 2 sprays into both nostrils daily. Patient not taking: Reported on 09/02/2014 04/09/14   Lupita Dawn, MD  gabapentin (NEURONTIN) 100 MG capsule Take 1 capsule (100 mg total) by mouth 3 (three) times daily. 02/25/15   Lupita Dawn, MD  omeprazole (PRILOSEC) 20 MG capsule TAKE 1 CAPSULE TWICE DAILY 04/01/15   Mary Sella  Ree Kida, MD  simvastatin (ZOCOR) 40 MG tablet Take 1 tablet (40 mg total) by mouth at bedtime. 09/04/14   Lupita Dawn, MD  terbinafine (LAMISIL AT) 1 % cream Apply 1 application topically 2 (two) times daily. 09/02/14   Lupita Dawn, MD   BP 119/71 mmHg  Pulse 79  Temp(Src) 97.7 F (36.5 C) (Oral)  Resp 16  SpO2 99% Physical Exam  Constitutional: She is oriented to person, place, and time. She appears well-developed and well-nourished. No distress.  HENT:  Head: Normocephalic and atraumatic. Head is without raccoon's eyes, without Battle's sign, without abrasion, without contusion and without laceration.  Right Ear: Tympanic membrane normal. No hemotympanum.  Left Ear:  Tympanic membrane normal. No hemotympanum.  Nose: Nose normal. No nasal septal hematoma. Right sinus exhibits no maxillary sinus tenderness and no frontal sinus tenderness. Left sinus exhibits no maxillary sinus tenderness and no frontal sinus tenderness.  Mouth/Throat: Uvula is midline, oropharynx is clear and moist and mucous membranes are normal. No oropharyngeal exudate.  Eyes: Conjunctivae and EOM are normal. Pupils are equal, round, and reactive to light. Right eye exhibits no discharge. Left eye exhibits no discharge. No scleral icterus.  Neck: Normal range of motion. Neck supple.  Cardiovascular: Normal rate, regular rhythm, normal heart sounds and intact distal pulses.   Pulmonary/Chest: Effort normal and breath sounds normal. No respiratory distress. She has no wheezes. She has no rales. She exhibits no tenderness.  Abdominal: Soft. Bowel sounds are normal. She exhibits no distension and no mass. There is no tenderness. There is no rebound and no guarding.  Musculoskeletal: She exhibits tenderness. She exhibits no edema.  TTP at upper thoracic and lumbar spine, no swelling contusions or abrasions noted. No c-spine tenderness, neck with FROM. Tenderness with light palpation noted to BLE, no contusions abrasions or swelling noted. Pt able to lift legs off bed and hold leg up against light resistance. Sensation intact. 2+ dorsalis pulses. Pt unable to stand due to pain/weakness.  Lymphadenopathy:    She has no cervical adenopathy.  Neurological: She is alert and oriented to person, place, and time. No cranial nerve deficit or sensory deficit. She displays a negative Romberg sign. Coordination normal.  Skin: Skin is warm and dry. She is not diaphoretic.  Nursing note and vitals reviewed.   ED Course  Procedures (including critical care time) Labs Review Labs Reviewed  URINALYSIS, ROUTINE W REFLEX MICROSCOPIC (NOT AT [facility])  I-STAT CHEM 8, ED    Imaging Review No results found. I have  personally reviewed and evaluated these images and lab results as part of my medical decision-making.  Filed Vitals:   06/19/15 1458  BP: 135/77  Pulse: 80  Temp:   Resp: 16    MDM   Final diagnoses:  Fall, initial encounter  Musculoskeletal pain    Pt presents with weakness, back pain and leg pain and HA that started after a fall she had 1 week ago. Denies LOC. Pt has been taking ibuprofen at home without relief. VSS. No neuro deficits. TTP at thoracic and lumbar spine. TTP of lower extremities with light palpation. Pt able to lift legs off bed and only able to keep legs raises with light resistance. Pt given toradol for pain.   CT head and back xrays unremarkable. Pt able to stand and ambulate to bathroom without assistance. Plan to d/c pt home. Pt advised to continue using ibuprofen and tylenol for pain control. Pt advised to follow up with PCP in  3-4 days.   Evaluation does not show pathology requring ongoing emergent intervention or admission. Pt is hemodynamically stable and mentating appropriately. Discussed findings/results and plan with patient/guardian, who agrees with plan. All questions answered. Return precautions discussed and outpatient follow up given.    [place], [place] 06/19/15 [place], [place] 06/19/15 1640  Forde Dandy, MD 06/19/15 [phone]

## 2015-06-19 NOTE — ED Notes (Signed)
Ginger Ale given to pt .

## 2015-06-19 NOTE — Discharge Instructions (Signed)
Please take Ibuprofen 600mg  every 6 hours as needed for pain control.  Please follow up with your primary care provider in 3-4 days. Please return to the Emergency Department if symptoms worsen or new onset of fever, change in mental status, difficulty walking.

## 2015-06-25 ENCOUNTER — Ambulatory Visit: Payer: Medicare Other | Admitting: Family Medicine

## 2015-06-27 ENCOUNTER — Ambulatory Visit: Payer: Medicare Other | Admitting: Family Medicine

## 2015-07-02 ENCOUNTER — Ambulatory Visit: Payer: Medicare Other | Admitting: Family Medicine

## 2015-07-05 ENCOUNTER — Other Ambulatory Visit: Payer: Self-pay | Admitting: Family Medicine

## 2015-07-07 ENCOUNTER — Encounter: Payer: Self-pay | Admitting: Family Medicine

## 2015-07-07 ENCOUNTER — Ambulatory Visit (INDEPENDENT_AMBULATORY_CARE_PROVIDER_SITE_OTHER): Payer: Medicare Other | Admitting: Family Medicine

## 2015-07-07 VITALS — BP 137/84 | HR 89 | Temp 98.2°F | Ht 61.0 in | Wt 165.6 lb

## 2015-07-07 DIAGNOSIS — M79671 Pain in right foot: Secondary | ICD-10-CM

## 2015-07-07 DIAGNOSIS — B9789 Other viral agents as the cause of diseases classified elsewhere: Secondary | ICD-10-CM

## 2015-07-07 DIAGNOSIS — J069 Acute upper respiratory infection, unspecified: Secondary | ICD-10-CM

## 2015-07-07 MED ORDER — HYDROCODONE-HOMATROPINE 5-1.5 MG/5ML PO SYRP: 5.0000 mL | ORAL_SOLUTION | Freq: Three times a day (TID) | ORAL | Status: DC | PRN

## 2015-07-07 NOTE — Patient Instructions (Signed)
Prescribed cough syrup for you Use caution as it might make you sleepy Take tylenol as needed Drink plenty of liquids Return if not getting better by Thursday or Friday  Dr Ree Kida already refilled your anafranil  Go get xray of your foot - will send you letter or call with results  Be well, Dr. Ardelia Mems   Upper Respiratory Infection, Adult Most upper respiratory infections (URIs) are a viral infection of the air passages leading to the lungs. A URI affects the nose, throat, and upper air passages. The most common type of URI is nasopharyngitis and is typically referred to as "the common cold." URIs run their course and usually go away on their own. Most of the time, a URI does not require medical attention, but sometimes a bacterial infection in the upper airways can follow a viral infection. This is called a secondary infection. Sinus and middle ear infections are common types of secondary upper respiratory infections. Bacterial pneumonia can also complicate a URI. A URI can worsen asthma and chronic obstructive pulmonary disease (COPD). Sometimes, these complications can require emergency medical care and may be life threatening.  CAUSES Almost all URIs are caused by viruses. A virus is a type of germ and can spread from one person to another.  RISKS FACTORS You may be at risk for a URI if:   You smoke.   You have chronic heart or lung disease.  You have a weakened defense (immune) system.   You are very young or very old.   You have nasal allergies or asthma.  You work in crowded or poorly ventilated areas.  You work in health care facilities or schools. SIGNS AND SYMPTOMS  Symptoms typically develop 2-3 days after you come in contact with a cold virus. Most viral URIs last 7-10 days. However, viral URIs from the influenza virus (flu virus) can last 14-18 days and are typically more severe. Symptoms may include:   Runny or stuffy (congested) nose.   Sneezing.    Cough.   Sore throat.   Headache.   Fatigue.   Fever.   Loss of appetite.   Pain in your forehead, behind your eyes, and over your cheekbones (sinus pain).  Muscle aches.  DIAGNOSIS  Your health care provider may diagnose a URI by:  Physical exam.  Tests to check that your symptoms are not due to another condition such as:  Strep throat.  Sinusitis.  Pneumonia.  Asthma. TREATMENT  A URI goes away on its own with time. It cannot be cured with medicines, but medicines may be prescribed or recommended to relieve symptoms. Medicines may help:  Reduce your fever.  Reduce your cough.  Relieve nasal congestion. HOME CARE INSTRUCTIONS   Take medicines only as directed by your health care provider.   Gargle warm saltwater or take cough drops to comfort your throat as directed by your health care provider.  Use a warm mist humidifier or inhale steam from a shower to increase air moisture. This may make it easier to breathe.  Drink enough fluid to keep your urine clear or pale yellow.   Eat soups and other clear broths and maintain good nutrition.   Rest as needed.   Return to work when your temperature has returned to normal or as your health care provider advises. You may need to stay home longer to avoid infecting others. You can also use a face mask and careful hand washing to prevent spread of the virus.  Increase the usage  of your inhaler if you have asthma.   Do not use any tobacco products, including cigarettes, chewing tobacco, or electronic cigarettes. If you need help quitting, ask your health care provider. PREVENTION  The best way to protect yourself from getting a cold is to practice good hygiene.   Avoid oral or hand contact with people with cold symptoms.   Wash your hands often if contact occurs.  There is no clear evidence that vitamin C, vitamin E, echinacea, or exercise reduces the chance of developing a cold. However, it is  always recommended to get plenty of rest, exercise, and practice good nutrition.  SEEK MEDICAL CARE IF:   You are getting worse rather than better.   Your symptoms are not controlled by medicine.   You have chills.  You have worsening shortness of breath.  You have brown or red mucus.  You have yellow or brown nasal discharge.  You have pain in your face, especially when you bend forward.  You have a fever.  You have swollen neck glands.  You have pain while swallowing.  You have white areas in the back of your throat. SEEK IMMEDIATE MEDICAL CARE IF:   You have severe or persistent:  Headache.  Ear pain.  Sinus pain.  Chest pain.  You have chronic lung disease and any of the following:  Wheezing.  Prolonged cough.  Coughing up blood.  A change in your usual mucus.  You have a stiff neck.  You have changes in your:  Vision.  Hearing.  Thinking.  Mood. MAKE SURE YOU:   Understand these instructions.  Will watch your condition.  Will get help right away if you are not doing well or get worse.   This information is not intended to replace advice given to you by your health care provider. Make sure you discuss any questions you have with your health care provider.   Document Released: 03/09/2001 Document Revised: 01/28/2015 Document Reviewed: 12/19/2013 Elsevier Interactive Patient Education [employer].

## 2015-07-07 NOTE — Progress Notes (Signed)
Date of Visit: 07/07/2015   HPI:  Patient presents for ED follow up. Seen in ED on Sept 22 after falling down wheelchair ramp one week prior. Underwent CT of head and back xrays, advised tylenol and ibuprofen for pain control.   Patient reports some continued back pain, improving. Denies having fever, saddle anesthesia, lower extremity weakness, or problems with stooling or urination. Does have significant pain in R foot, residual since fall. Is able to ambulate. Did not have xrays done of R foot.  Has also had sore throat since Thursday, associated with cough and nasal congestion. Drinking and eating all right.  ROS: See HPI.  [place]: history of obesity, panic disorder, OCD, arthritis, fibromyalgia, HLD  PHYSICAL EXAM: BP 137/84 mmHg  Pulse 89  Temp(Src) 98.2 F (36.8 C) (Oral)  Ht 5\' 1"  (1.549 m)  Wt 165 lb 9.6 oz (75.116 kg)  BMI 31.31 kg/m2 Gen: NAD, pleasant, cooperative HEENT: normocephalic, atraumatic. moist mucous membranes. Oropharynx clear and moist. TMs normal bilateral. Mild shoddy anterior LAD. Nares with clear discharge. Heart: regular rate and rhythm no murmur Lungs: clear to auscultation bilaterally NWOB Neuro: grossly nonfocal speech normal Ext: full strenght bilateral lower extremity. R foot atraumatic with normal DP pulse and distal sensation, but TTP over dorsum of foot over tarsal bones  ASSESSMENT/PLAN:  1. URI - no findings on exam or history to suggest bacterial etiology. Supportive care with tylenol, hycodan cough syrup, neti pot, plenty of liquids, nasal saline spray. Follow up if not improving  2. Fall - slowly improving. Residual pain in R foot with bony tenderness warrants xray to r/o fracture. Order entered.   [place]. Ardelia Mems, [place]

## 2015-07-14 ENCOUNTER — Telehealth: Payer: Self-pay | Admitting: Family Medicine

## 2015-07-14 DIAGNOSIS — F419 Anxiety disorder, unspecified: Secondary | ICD-10-CM

## 2015-07-14 MED ORDER — ALPRAZOLAM 2 MG PO TABS: 2.0000 mg | ORAL_TABLET | Freq: Three times a day (TID) | ORAL | Status: DC | PRN

## 2015-07-14 NOTE — Telephone Encounter (Signed)
RN staff - please callin xanax 2 mg TID prn, dispense #90, refill #0, schedule follow up as well, thanks

## 2015-07-14 NOTE — Telephone Encounter (Signed)
Pt called and needs a refill on her Xanax. It is due on Wednesday 19. Please make sure that it is name brand and medically necessary. jw

## 2015-07-16 ENCOUNTER — Other Ambulatory Visit: Payer: Self-pay | Admitting: Family Medicine

## 2015-07-17 ENCOUNTER — Ambulatory Visit: Payer: Medicare Other | Admitting: Family Medicine

## 2015-07-17 ENCOUNTER — Other Ambulatory Visit: Payer: Self-pay | Admitting: Family Medicine

## 2015-07-17 NOTE — Telephone Encounter (Signed)
[facility] 

## 2015-07-31 ENCOUNTER — Other Ambulatory Visit: Payer: Self-pay | Admitting: Family Medicine

## 2015-08-01 ENCOUNTER — Ambulatory Visit: Payer: Medicare Other | Admitting: Family Medicine

## 2015-08-14 ENCOUNTER — Other Ambulatory Visit: Payer: Self-pay | Admitting: Family Medicine

## 2015-08-14 DIAGNOSIS — F419 Anxiety disorder, unspecified: Secondary | ICD-10-CM

## 2015-08-14 NOTE — Telephone Encounter (Signed)
Pt called and needs a refill on her Xanax. As always name brand and medically necessary when sending this in. jw

## 2015-08-15 MED ORDER — ALPRAZOLAM 2 MG PO TABS: 2.0000 mg | ORAL_TABLET | Freq: Three times a day (TID) | ORAL | Status: DC | PRN

## 2015-08-15 NOTE — Telephone Encounter (Signed)
Rn staff - please call in Xanax 2 mg TID prn, dispense #90, refill 0, thanks

## 2015-08-15 NOTE — Telephone Encounter (Signed)
Rx called into patient pharmacy. 

## 2015-09-08 ENCOUNTER — Telehealth: Payer: Self-pay | Admitting: *Deleted

## 2015-09-08 NOTE — Telephone Encounter (Signed)
Patient verified DOB Patient states she had a miss call from Dr. Ree Kida. No telephone message is noted. Please follow up with the patient

## 2015-09-11 ENCOUNTER — Other Ambulatory Visit: Payer: Self-pay | Admitting: Family Medicine

## 2015-09-11 ENCOUNTER — Ambulatory Visit: Payer: Medicare Other | Admitting: Family Medicine

## 2015-09-11 DIAGNOSIS — F419 Anxiety disorder, unspecified: Secondary | ICD-10-CM

## 2015-09-11 MED ORDER — ALPRAZOLAM 2 MG PO TABS: 2.0000 mg | ORAL_TABLET | Freq: Three times a day (TID) | ORAL | Status: DC | PRN

## 2015-09-11 NOTE — Telephone Encounter (Signed)
RN staff - please call in Xanax 2 mg TID prn anxiety, dispense #90, refill #0, thanks

## 2015-09-11 NOTE — Telephone Encounter (Signed)
Pt called and needs a refill on her Xanax. Please remember brand name and medically necessary. jw

## 2015-09-12 ENCOUNTER — Telehealth: Payer: Self-pay | Admitting: Obstetrics and Gynecology

## 2015-09-12 NOTE — Telephone Encounter (Signed)
Family Medicine Emergency Line Telephone Note   Patient calling wondering about her Xanax refill. She states that she was told that her Xanax was refilled. She had tried to call the pharmacy and they said they did not receive the prescription. Discussed with patient, unable to prescribe Xanax over the phone or follow-up on refill status of her medication. The best that I can do is for this message to her PCP. Per reviewing her chart it does appear that Dr. Ree Kida refilled her medication today and sent in via phone.   Luiz Blare, DO 09/12/2015, 6:12 PM PGY-2, [place]

## 2015-09-12 NOTE — Telephone Encounter (Signed)
Rx called into patient pharmacy. 

## 2015-09-15 NOTE — Telephone Encounter (Signed)
RN staff - please verify with the pharmacy that xanax was called in, thanks

## 2015-09-16 NOTE — Telephone Encounter (Signed)
Rx was called in before closing on 12/16.

## 2015-10-02 ENCOUNTER — Ambulatory Visit: Payer: Medicare Other | Admitting: Family Medicine

## 2015-10-06 ENCOUNTER — Other Ambulatory Visit: Payer: Self-pay | Admitting: Family Medicine

## 2015-10-09 ENCOUNTER — Other Ambulatory Visit: Payer: Self-pay | Admitting: Family Medicine

## 2015-10-10 ENCOUNTER — Ambulatory Visit: Payer: Medicare Other | Admitting: Family Medicine

## 2015-10-14 ENCOUNTER — Other Ambulatory Visit: Payer: Self-pay | Admitting: Family Medicine

## 2015-10-14 DIAGNOSIS — F419 Anxiety disorder, unspecified: Secondary | ICD-10-CM

## 2015-10-14 MED ORDER — ALPRAZOLAM 2 MG PO TABS: 2.0000 mg | ORAL_TABLET | Freq: Three times a day (TID) | ORAL | Status: DC | PRN

## 2015-10-14 NOTE — Telephone Encounter (Signed)
RN staff - please call in xanax 2 mg TID prn anxiety, dispense #90, no refill; please inform patient that I will NOT refill this medication again unless she is seen in clinic.

## 2015-10-14 NOTE — Telephone Encounter (Signed)
Pt called and would like a refill called in on her Xanax. Name brand and medically necessary. jw

## 2015-10-15 ENCOUNTER — Telehealth: Payer: Self-pay | Admitting: Family Medicine

## 2015-10-15 NOTE — Telephone Encounter (Signed)
Rx called into patient pharmacy. 

## 2015-10-15 NOTE — Telephone Encounter (Signed)
RN staff - please call patient and inform her that hard script for xanax placed in front office for pickup.

## 2015-10-15 NOTE — Telephone Encounter (Signed)
Pt needs her prescription for xanax hand written medically necessary and brand name. jw

## 2015-10-15 NOTE — Telephone Encounter (Signed)
Left message with patient sister that rx was ready to be picked up.

## 2015-10-15 NOTE — Telephone Encounter (Signed)
Received call from pharmacy. Due to new medicare/medicaid rules for 2017 xanax cannot be called into pharmacy patient must bring in hardcopy to pharmacy. Will forward to PCP.

## 2015-10-15 NOTE — Telephone Encounter (Signed)
Hard script placed in front office for pickup.

## 2015-10-25 ENCOUNTER — Other Ambulatory Visit: Payer: Self-pay | Admitting: Family Medicine

## 2015-11-13 ENCOUNTER — Encounter: Payer: Self-pay | Admitting: Family Medicine

## 2015-11-13 ENCOUNTER — Ambulatory Visit (INDEPENDENT_AMBULATORY_CARE_PROVIDER_SITE_OTHER): Payer: Medicare Other | Admitting: Family Medicine

## 2015-11-13 VITALS — BP 138/115 | HR 109 | Temp 97.5°F | Ht 61.0 in | Wt 170.8 lb

## 2015-11-13 DIAGNOSIS — F419 Anxiety disorder, unspecified: Secondary | ICD-10-CM | POA: Diagnosis not present

## 2015-11-13 DIAGNOSIS — L290 Pruritus ani: Secondary | ICD-10-CM

## 2015-11-13 DIAGNOSIS — G894 Chronic pain syndrome: Secondary | ICD-10-CM

## 2015-11-13 DIAGNOSIS — F429 Obsessive-compulsive disorder, unspecified: Secondary | ICD-10-CM

## 2015-11-13 DIAGNOSIS — Z0000 Encounter for general adult medical examination without abnormal findings: Secondary | ICD-10-CM | POA: Diagnosis not present

## 2015-11-13 MED ORDER — ALPRAZOLAM 2 MG PO TABS: 2.0000 mg | ORAL_TABLET | Freq: Three times a day (TID) | ORAL | Status: DC | PRN

## 2015-11-13 MED ORDER — HYDROCORTISONE 2.5 % EX LOTN: TOPICAL_LOTION | Freq: Two times a day (BID) | CUTANEOUS | Status: DC

## 2015-11-13 MED ORDER — MELOXICAM 7.5 MG PO TABS: 7.5000 mg | ORAL_TABLET | Freq: Every day | ORAL | Status: DC

## 2015-11-13 NOTE — Assessment & Plan Note (Signed)
Currently being seen at [place] by [place] (phone # [phone]) -patient gave verbal permission to contact LCSW -Will call and discuss medication regimen (consider switch to SNRI or TCA for better pain control).

## 2015-11-13 NOTE — Assessment & Plan Note (Signed)
Referral to GI for screening colonoscopy.  Patient to return for annual well women/preventative visit.

## 2015-11-13 NOTE — Assessment & Plan Note (Addendum)
Generalized pain of multiple locations (primarily low back and legs today). MSK exam grossly unremarkable other than possible OA of bilateral knee/lumbar spine). Likely not associated with Statin as started before starting medication. No red flag signs/symptoms. Failed Gabapentin due to hallucinations. Not a candidate for Cymbalta/TCA's as already on Anafranil. -trial of Mobic daily -will discuss regimen [facility] (consider change of Anafranil to TCA or SNRI for better pain control)

## 2015-11-13 NOTE — Progress Notes (Signed)
   Subjective:    Patient ID: Jenna Burke, female    DOB: 03/15/1961, 55 y.o.   MRN: [id number]  HPI 55 y/o presents for routine follow up.  Leg pain - "popping sound" in knees when she stands, constant stabbing pain in thighs/knee/legs, arthritis tylenol helps with pain sometimes (taking daily and unable to quantify), also took two 325 mg daily which helps pain (but felt cold). Patient unable to tell what makes worse. Took Amitryptaline from family member which helped the pain, Previously took Gabapentin and had "hallucinations" from this medication (patient self discontinued), pain started before starting Simvastatin; also has associated low back pain, no radiation, no bladder/bowel incontinence, no fevers  Anal itching - still having itching, constant, will "dig" at the area with tissue when she uses the restroom, does report blood on tissue, none on the stool, no stomach pain, mild constipation (BM every other day, normal stool per patient".   HM - needs a referral for coloscopy  Anxiety/OCD - Seen by LCSW Horatio Pel at [facility] a number of times, has not seen a psychiatrist  Social - nonsmoker  Review of Systems  Constitutional: Negative for chills, diaphoresis and fatigue.  Respiratory: Negative for chest tightness and shortness of breath.   Gastrointestinal: Positive for constipation. Negative for nausea and diarrhea.  Musculoskeletal: Positive for back pain and arthralgias.       Objective:   Physical Exam Filed Vitals:   11/13/15 1047  BP: 138/115  Pulse: 109  Temp: 97.5 F (36.4 C)   Gen: pleasant Caucasian female, NAD Abd: soft, no tenderness, normal bowel sounds MSK: Lumbar Spine - mild midline and bilateral paraspinal tenderenss, SLT negative bilaterally; Left knee - no swelling, to tenderness, no erythema, crepitus with flexion/extension, lachman/varus/valgus testing normal, negative McMurray; Left knee - no swelling, to tenderness, no erythema, crepitus with  flexion/extension, lachman/varus/valgus testing normal, negative McMurray; Bilateral hip - negative log roll, negative FABER/FADIR Neuro: strength 5/5 to hip flexion/knee extension/knee flexion bilaterally, sensation to light touch grossly intact, 2+ patellar reflexes bilaterally Skin: no LE rash Rectal: chaperone present, no internal/external hemorrhoids, no gross blood, normal tone, erythema with excoriation present in surrounding rectal area.   Pinworm testing in 2015 negative     Assessment & Plan:  Chronic pain syndrome Generalized pain of multiple locations (primarily low back and legs today). MSK exam grossly unremarkable other than possible OA of bilateral knee/lumbar spine). Likely not associated with Statin as started before starting medication. No red flag signs/symptoms. Failed Gabapentin due to hallucinations. Not a candidate for Cymbalta/TCA's as already on Anafranil. -trial of Mobic daily -will discuss regimen [facility] (consider change of Anafranil to TCA or SNRI for better pain control)  Anal itching Continued rectal itching. Rectal exam unremarkable except irritation/erythema/excoriation surrounding anus. Failed trial of antifungal previously. Pinworm test negative in 2015.  -trial of Hydrocortisone cream.   Preventative health care Referral to GI for screening colonoscopy.  Patient to return for annual well women/preventative visit.   Obsessive-compulsive disorder Currently being seen at [facility] by [facility] (phone # [phone]) -patient gave verbal permission to contact LCSW -Will call and discuss medication regimen (consider switch to SNRI or TCA for better pain control).

## 2015-11-13 NOTE — Patient Instructions (Signed)
It was nice to see you today.  Leg Pain - start Mobic 7.5 mg daily (anti-inflammatory)  Itching in your bottom - start Hydrocortisone cream, apply twice daily for one week, call if no improvement  Coloscopy - referral has been made to the stomach doctor for your colonoscopy, my office will call you to schedule   Anxiety - Dr. Loletha Grayer will call the [facility] and discuss therapy

## 2015-11-13 NOTE — Assessment & Plan Note (Signed)
Continued rectal itching. Rectal exam unremarkable except irritation/erythema/excoriation surrounding anus. Failed trial of antifungal previously. Pinworm test negative in 2015.  -trial of Hydrocortisone cream.

## 2015-11-17 ENCOUNTER — Emergency Department (HOSPITAL_COMMUNITY)
Admission: EM | Admit: 2015-11-17 | Discharge: 2015-11-17 | Disposition: A | Discharge status: Home / Self Care | Payer: Medicare Other | Attending: Emergency Medicine | Admitting: Emergency Medicine

## 2015-11-17 ENCOUNTER — Emergency Department (HOSPITAL_COMMUNITY): Payer: Medicare Other

## 2015-11-17 ENCOUNTER — Encounter (HOSPITAL_COMMUNITY): Payer: Self-pay

## 2015-11-17 ENCOUNTER — Telehealth: Payer: Self-pay | Admitting: Family Medicine

## 2015-11-17 DIAGNOSIS — R0981 Nasal congestion: Secondary | ICD-10-CM | POA: Diagnosis not present

## 2015-11-17 DIAGNOSIS — Z791 Long term (current) use of non-steroidal anti-inflammatories (NSAID): Secondary | ICD-10-CM | POA: Insufficient documentation

## 2015-11-17 DIAGNOSIS — R059 Cough, unspecified: Secondary | ICD-10-CM

## 2015-11-17 DIAGNOSIS — R509 Fever, unspecified: Secondary | ICD-10-CM | POA: Diagnosis not present

## 2015-11-17 DIAGNOSIS — K219 Gastro-esophageal reflux disease without esophagitis: Secondary | ICD-10-CM | POA: Insufficient documentation

## 2015-11-17 DIAGNOSIS — M791 Myalgia, unspecified site: Secondary | ICD-10-CM

## 2015-11-17 DIAGNOSIS — J3489 Other specified disorders of nose and nasal sinuses: Secondary | ICD-10-CM | POA: Diagnosis not present

## 2015-11-17 DIAGNOSIS — G44209 Tension-type headache, unspecified, not intractable: Secondary | ICD-10-CM

## 2015-11-17 DIAGNOSIS — Z79899 Other long term (current) drug therapy: Secondary | ICD-10-CM | POA: Insufficient documentation

## 2015-11-17 DIAGNOSIS — R0602 Shortness of breath: Secondary | ICD-10-CM | POA: Insufficient documentation

## 2015-11-17 DIAGNOSIS — Z8781 Personal history of (healed) traumatic fracture: Secondary | ICD-10-CM | POA: Diagnosis not present

## 2015-11-17 DIAGNOSIS — R05 Cough: Secondary | ICD-10-CM

## 2015-11-17 DIAGNOSIS — Z87891 Personal history of nicotine dependence: Secondary | ICD-10-CM | POA: Insufficient documentation

## 2015-11-17 DIAGNOSIS — R52 Pain, unspecified: Secondary | ICD-10-CM | POA: Diagnosis present

## 2015-11-17 MED ORDER — BENZONATATE 100 MG PO CAPS: 100.0000 mg | ORAL_CAPSULE | Freq: Three times a day (TID) | ORAL | Status: DC | PRN

## 2015-11-17 MED ORDER — KETOROLAC TROMETHAMINE 15 MG/ML IJ SOLN
30.0000 mg | Freq: Once | INTRAMUSCULAR | Status: AC
  Administered 2015-11-17: 30 mg via INTRAMUSCULAR
  Filled 2015-11-17: qty 2

## 2015-11-17 NOTE — ED Notes (Signed)
Patient here with generalized body aches, congestion and cough since Saturday, no distress

## 2015-11-17 NOTE — ED Notes (Signed)
MD at the bedside  

## 2015-11-17 NOTE — ED Notes (Signed)
Pedro MD at the bedside

## 2015-11-17 NOTE — Discharge Instructions (Signed)

## 2015-11-17 NOTE — Telephone Encounter (Signed)
Left voicemail stating that patient needs to stop by office to complete Medical Release of Information Form which would allow me to communicate with the [facility] about her care.   RN staff - please complete a release of information form and place in the front office so that the patient can sign. Thanks.

## 2015-11-17 NOTE — Telephone Encounter (Signed)
ROI placed up front for patient.

## 2015-11-17 NOTE — ED Notes (Signed)
Visitor given a cup of ice water with a straw

## 2015-11-17 NOTE — ED Provider Notes (Signed)
CSN: [id number]     Arrival date & time 11/17/15  [id number] History   First MD Initiated Contact with Patient 11/17/15 1224     Chief Complaint  Patient presents with  . Generalized Body Aches     (Consider location/radiation/quality/duration/timing/severity/associated sxs/prior Treatment) Patient is a 55 y.o. female presenting with cough.  Cough Cough characteristics:  Non-productive Severity:  Moderate Onset quality:  Gradual Duration:  2 days Timing:  Intermittent Progression:  Worsening Chronicity:  New Smoker: no   Context: upper respiratory infection   Relieved by:  Nothing Worsened by:  Nothing tried Ineffective treatments:  Cough suppressants Associated symptoms: chills, fever, rhinorrhea, shortness of breath and sinus congestion   Associated symptoms: no chest pain, no ear pain, no headaches, no rash and no sore throat     Past Medical History  Diagnosis Date  . Esophageal reflux disease 10/1999    EGD showed esophagitis  . Fracture of metatarsal bone of left foot 08/2003    3&4  . 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 Seizures [facility])    Past Surgical History  Procedure Laterality Date  . Biopsy endometrial  07/1991  . Biopsy endometrial  06/2004    secretory  . Perineal hidradenitis excision  12/2002    incision and drainage  . Colonoscopy  623/2006    normal  . Wrist surgery      Right   Family History  Problem Relation Age of Onset  . Cancer Mother     colon  . Heart attack Father   . OCD Father   . Osteoarthritis Maternal Grandmother   . Cancer Maternal Grandmother     stomach   Social History  Substance Use Topics  . Smoking status: Former Smoker    Quit date: 12/15/2010  . Smokeless tobacco: Never Used  . Alcohol Use: No   OB History    No data available     Review of Systems  Constitutional: Positive for fever and chills. Negative for appetite change and fatigue.  HENT: Positive for rhinorrhea. Negative  for congestion, ear pain, facial swelling, mouth sores and sore throat.   Eyes: Negative for visual disturbance.  Respiratory: Positive for cough and shortness of breath. Negative for chest tightness.   Cardiovascular: Negative for chest pain and palpitations.  Gastrointestinal: Negative for nausea, vomiting, abdominal pain, diarrhea and blood in stool.  Endocrine: Negative for cold intolerance and heat intolerance.  Genitourinary: Negative for frequency, decreased urine volume and difficulty urinating.  Musculoskeletal: Negative for back pain and neck stiffness.  Skin: Negative for rash.  Neurological: Negative for dizziness, weakness, light-headedness and headaches.      Allergies  Caffeine; Paroxetine; and Tramadol  Home Medications   Prior to Admission medications   Medication Sig Start Date End Date Taking? Authorizing Provider  alprazolam Duanne Moron) 2 MG tablet Take 1 tablet (2 mg total) by mouth 3 (three) times daily as needed. For anxiety Brand name medically necessary 11/13/15  Yes Lupita Dawn, MD  ANAFRANIL 50 MG capsule TAKE 2 CAPSULES BY MOUTH EVERY MORNING AND 3 CAPSULES AT BEDTIME 07/07/15  Yes Lupita Dawn, MD  guaiFENesin (MUCINEX) 600 MG 12 hr tablet Take 600 mg by mouth 2 (two) times daily.   Yes Historical Provider, MD  hydrocortisone 2.5 % lotion Apply topically 2 (two) times daily. 11/13/15  Yes Lupita Dawn, MD  meloxicam (MOBIC) 7.5 MG tablet Take 1 tablet (7.5 mg total) by  mouth daily. 11/13/15  Yes Lupita Dawn, MD  omeprazole (PRILOSEC) 20 MG capsule TAKE (1) CAPSULE TWICE DAILY 10/27/15  Yes Lupita Dawn, MD  simvastatin (ZOCOR) 40 MG tablet TAKE 1 TABLET (40 MG TOTAL) BY MOUTH AT BEDTIME. 10/07/15  Yes Lupita Dawn, MD  benzonatate (TESSALON) 100 MG capsule Take 1 capsule (100 mg total) by mouth 3 (three) times daily as needed for cough. 11/17/15   Addison Lank, MD   BP 115/77 mmHg  Pulse 95  Temp(Src) 99.7 F (37.6 C) (Oral)  Resp 18  SpO2 98% Physical  Exam  Constitutional: She is oriented to person, place, and time. She appears well-developed and well-nourished. No distress.  HENT:  Head: Normocephalic and atraumatic.  Right Ear: External ear normal.  Left Ear: External ear normal.  Nose: Nose normal.  Mouth/Throat: No oropharyngeal exudate, posterior oropharyngeal edema or tonsillar abscesses.  Frontal, maxillary sinus tenderness to palpation.   Eyes: Conjunctivae and EOM are normal. Pupils are equal, round, and reactive to light. Right eye exhibits no discharge. Left eye exhibits no discharge. No scleral icterus.  Neck: Normal range of motion. Neck supple.  Cardiovascular: Normal rate, regular rhythm and normal heart sounds.  Exam reveals no gallop and no friction rub.   No murmur heard. Pulmonary/Chest: Effort normal and breath sounds normal. No stridor. No respiratory distress. She has no wheezes.  Abdominal: Soft. She exhibits no distension. There is no tenderness.  Musculoskeletal: She exhibits no edema or tenderness.  Neurological: She is alert and oriented to person, place, and time.  Skin: Skin is warm and dry. No rash noted. She is not diaphoretic. No erythema.  Psychiatric: She has a normal mood and affect.    ED Course  Procedures (including critical care time) Labs Review Labs Reviewed - No data to display  Imaging Review Dg Chest 2 View  11/17/2015  CLINICAL DATA:  Cough and fever since yesterday. EXAM: CHEST  2 VIEW COMPARISON:  06/03/2014 FINDINGS: The heart size and mediastinal contours are within normal limits. Mild scarring is again seen involving the right middle lobe and left lower lobe. No evidence of pulmonary infiltrate or edema. No evidence of pneumothorax or pleural effusion. The visualized skeletal structures are unremarkable. IMPRESSION: Stable bilateral scarring.  No active cardiopulmonary disease. Electronically Signed   By: Earle Gell M.D.   On: 11/17/2015 13:50   I have personally reviewed and  evaluated these images and lab results as part of my medical decision-making.   EKG Interpretation None      MDM   55 year old female with no significant past medical history presents to the ED with 2 days of flulike symptoms as well as gradually worsening frontal headache. Rest of the history as above.  On arrival patient is afebrile with stable vital signs. Well-appearing and well-hydrated in no acute distress without evidence of toxicity. Presentation most consistent with viral process. Headache etiology most consistent with sinus headache. Gradual onset so doubt subarachnoid hemorrhage. No focal deficits-doubt IPH, IIH, or mass.  Chest x-ray without evidence of pneumonia.  Patient given Toradol for symptomatic treatment which had some improvement in her symptoms.  Patient is able to tolerate by mouth intake. She is appropriate for discharge with strict return precautions. She has no history of asthma, COPD, or diabetes to require Tamiflu treatment. He will follow up with PCP as needed.  Patient was seen in conjunction with Dr. Rex Kras.  Diagnostic studies were interpreted by me and use to my clinical decision-making.  Final diagnoses:  Myalgia  Tension-type headache, not intractable, unspecified chronicity pattern  Cough  Congestion of nasal sinus        Addison Lank, MD 11/17/15 [place], MD 11/20/15 [phone]

## 2015-11-21 ENCOUNTER — Ambulatory Visit (INDEPENDENT_AMBULATORY_CARE_PROVIDER_SITE_OTHER): Payer: Medicare Other | Admitting: Family Medicine

## 2015-11-21 ENCOUNTER — Encounter: Payer: Self-pay | Admitting: Family Medicine

## 2015-11-21 VITALS — BP 115/81 | HR 93 | Temp 98.1°F | Wt 164.0 lb

## 2015-11-21 DIAGNOSIS — J069 Acute upper respiratory infection, unspecified: Secondary | ICD-10-CM

## 2015-11-21 MED ORDER — DEXTROMETHORPHAN HBR 15 MG/5ML PO SYRP: 10.0000 mL | ORAL_SOLUTION | Freq: Four times a day (QID) | ORAL | Status: DC | PRN

## 2015-11-21 MED ORDER — FLUTICASONE PROPIONATE 50 MCG/ACT NA SUSP: 2.0000 | Freq: Every day | NASAL | Status: DC

## 2015-11-21 NOTE — Patient Instructions (Signed)
I have prescribed you flonse, use 2 sprays in each nostril daily. I have also prescribed you a cough suppressant called dextromethorphan. Take Ibuprofen of  Tylenol as needed. I suspect you have a viral infection which needs to run its course, it normally hits its peak around 1 to 1.5 weeks and then slowly resolves. The cough will be the last to resolve and can sometimes takes months. If you develop new symptoms or note that you're symptoms are not improving by next week, please come back in and see [place].  Acute Bronchitis Bronchitis is inflammation of the airways that extend from the windpipe into the lungs (bronchi). The inflammation often causes mucus to develop. This leads to a cough, which is the most common symptom of bronchitis.  In acute bronchitis, the condition usually develops suddenly and goes away over time, usually in a couple weeks. Smoking, allergies, and asthma can make bronchitis worse. Repeated episodes of bronchitis may cause further lung problems.  CAUSES Acute bronchitis is most often caused by the same virus that causes a cold. The virus can spread from person to person (contagious) through coughing, sneezing, and touching contaminated objects. SIGNS AND SYMPTOMS   Cough.   Fever.   Coughing up mucus.   Body aches.   Chest congestion.   Chills.   Shortness of breath.   Sore throat.  DIAGNOSIS  Acute bronchitis is usually diagnosed through a physical exam. Your health care provider will also ask you questions about your medical history. Tests, such as chest X-rays, are sometimes done to rule out other conditions.  TREATMENT  Acute bronchitis usually goes away in a couple weeks. Oftentimes, no medical treatment is necessary. Medicines are sometimes given for relief of fever or cough. Antibiotic medicines are usually not needed but may be prescribed in certain situations. In some cases, an inhaler may be recommended to help reduce shortness of breath and  control the cough. A cool mist vaporizer may also be used to help thin bronchial secretions and make it easier to clear the chest.  HOME CARE INSTRUCTIONS  Get plenty of rest.   Drink enough fluids to keep your urine clear or pale yellow (unless you have a medical condition that requires fluid restriction). Increasing fluids may help thin your respiratory secretions (sputum) and reduce chest congestion, and it will prevent dehydration.   Take medicines only as directed by your health care provider.  If you were prescribed an antibiotic medicine, finish it all even if you start to feel better.  Avoid smoking and secondhand smoke. Exposure to cigarette smoke or irritating chemicals will make bronchitis worse. If you are a smoker, consider using nicotine gum or skin patches to help control withdrawal symptoms. Quitting smoking will help your lungs heal faster.   Reduce the chances of another bout of acute bronchitis by washing your hands frequently, avoiding people with cold symptoms, and trying not to touch your hands to your mouth, nose, or eyes.   Keep all follow-up visits as directed by your health care provider.  SEEK MEDICAL CARE IF: Your symptoms do not improve after 1 week of treatment.  SEEK IMMEDIATE MEDICAL CARE IF:  You develop an increased fever or chills.   You have chest pain.   You have severe shortness of breath.  You have bloody sputum.   You develop dehydration.  You faint or repeatedly feel like you are going to pass out.  You develop repeated vomiting.  You develop a severe headache. MAKE SURE YOU:  Understand these instructions.  Will watch your condition.  Will get help right away if you are not doing well or get worse.   This information is not intended to replace advice given to you by your health care provider. Make sure you discuss any questions you have with your health care provider.   Document Released: 10/21/2004 Document Revised:  10/04/2014 Document Reviewed: 03/06/2013 Elsevier Interactive Patient Education [employer].

## 2015-11-21 NOTE — Progress Notes (Signed)
Patient ID: Jenna Burke, female   DOB: [birth year], 55 y.o.   MRN: [id number]    Subjective: CC: cold/flu symptoms HPI: Patient is a 55 y.o. female presenting to clinic today for concerns for cough.  COUGH  Has been coughing and wheezing since Sunday. Cough is: non-productive Her throat was sore. She then noticed congestion in her chest and in her head. She notes a headache that is "everywhere."  Her head feels like a "bomb that is going to explode." Medications tried: Mucinex, Tessalon perles, Tyelenol, and Tussin Taking blood pressure medications: no  Symptoms Runny nose: yes  Nasal congestion: none Mucous in back of throat: No  Throat burning or reflux: No  Wheezing or asthma: yes  Fever: Tmax 103 on Sunday,  No fevers since then Myalgias: yes Chest Pain: after coughing spell that is centrally located Shortness of breath: yes, with coughing spells  Leg swelling: none Hemoptysis: none  Weight loss: none Additionally, she developed diarrhea two days ago without abdominal pain, nausea, or vomiting.   She had a CXR in the ED on 2/20 that was negative. She was given an injection of Toradol which did not hlep. At that time it was suspected that she may have the flu or another viral illness, however given her lack of risk factors, she was not given a Rx for Tamiflu.     Social History: former smoker   ROS: All other systems reviewed and are negative.  Past Medical History Patient Active Problem List   Diagnosis Date Noted  . Viral upper respiratory illness 11/23/2015  . Chronic pain syndrome 11/13/2015  . Hot flashes, menopausal 02/25/2015  . Hyponatremia 09/04/2014  . Hyperlipidemia 09/04/2014  . Preventative health care 09/03/2014  . Screening for hypertension 09/02/2014  . Anal itching 04/10/2014  . Atypical chest pain 10/24/2013  . Epigastric pressure 07/23/2013  . Mallet deformity of fifth finger, left, acquired 10/26/2012  . SYMPTOMATIC MENOPAUSAL/FEMALE  CLIMACTERIC STATES 09/15/2010  . BACK PAIN 04/14/2010  . OSTEOARTHROSIS UNSPEC WHETHER GEN/LOC LOWER LEG 01/06/2010  . Fibromyalgia 09/30/2009  . Obesity 11/24/2006  . PANIC ATTACKS 11/24/2006  . Obsessive-compulsive disorder 11/24/2006  . Rosacea 11/24/2006  . CERVICAL SPINE DISORDER, NOS 11/24/2006    Medications- reviewed and updated Current Outpatient Prescriptions  Medication Sig Dispense Refill  . alprazolam (XANAX) 2 MG tablet Take 1 tablet (2 mg total) by mouth 3 (three) times daily as needed. For anxiety Brand name medically necessary 90 tablet 0  . ANAFRANIL 50 MG capsule TAKE 2 CAPSULES BY MOUTH EVERY MORNING AND 3 CAPSULES AT BEDTIME 150 capsule 5  . benzonatate (TESSALON) 100 MG capsule Take 1 capsule (100 mg total) by mouth 3 (three) times daily as needed for cough. 20 capsule 0  . dextromethorphan 15 MG/5ML syrup Take 10 mLs (30 mg total) by mouth 4 (four) times daily as needed for cough. 120 mL 0  . fluticasone (FLONASE) 50 MCG/ACT nasal spray Place 2 sprays into both nostrils daily. 16 g 6  . guaiFENesin (MUCINEX) 600 MG 12 hr tablet Take 600 mg by mouth 2 (two) times daily.    . hydrocortisone 2.5 % lotion Apply topically 2 (two) times daily. 59 mL 1  . meloxicam (MOBIC) 7.5 MG tablet Take 1 tablet (7.5 mg total) by mouth daily. 30 tablet 2  . omeprazole (PRILOSEC) 20 MG capsule TAKE (1) CAPSULE TWICE DAILY 60 capsule 2  . simvastatin (ZOCOR) 40 MG tablet TAKE 1 TABLET (40 MG TOTAL) BY MOUTH AT BEDTIME.  90 tablet 3   No current facility-administered medications for this visit.    Objective: Office vital signs reviewed. BP 115/81 mmHg  Pulse 93  Temp(Src) 98.1 F (36.7 C) (Oral)  Wt 74.39 kg (164 lb)  SpO2 99%   Physical Examination:  General: Awake, alert, well nourished, NAD ENMT:  TMs intact, normal light reflex, no erythema, no bulging. Nasal turbinates moist with some clear drainage. MMM, Oropharynx clear without erythema or tonsillar  exudate/hypertrophy Maxillary and frontal sinus tenderness.  Eyes: Conjunctiva non-injected. PERRL.  Cardio: RRR, no m/r/g noted.  Pulm: No increased WOB.  CTAB, without wheezes, rhonchi or crackles noted. Intermittently has a non-productive cough.  GI: soft, NT/ND,+BS x4, no hepatomegaly, no splenomegaly   Assessment/Plan: Viral upper respiratory illness Patient presenting with cough, previous fevers, myalgias, sinus congestion, and diarrhea, consistent with a viral illness. She continues to have good PO intake. Her lungs are clear and she does not appear to be in any respiratory distress. Discussed with the patient that this is most likely a viral infection given the clinical course and discussed the normal trajectory of this illness. Given her cough has no been improvement with Tessalon perles or guaifenesin, will write an Rx for dextromethorphan. Flonase for nasal symptoms. Discussed taking Tylenol as needed for discomfort. Can continue lozenges for sore throat. D/c Tussin and Mucinex. Discussed RTC precautions such as worsening symptoms or new symptoms.       No orders of the defined types were placed in this encounter.    Meds ordered this encounter  Medications  . fluticasone (FLONASE) 50 MCG/ACT nasal spray    Sig: Place 2 sprays into both nostrils daily.    Dispense:  16 g    Refill:  6  . dextromethorphan 15 MG/5ML syrup    Sig: Take 10 mLs (30 mg total) by mouth 4 (four) times daily as needed for cough.    Dispense:  120 mL    Refill:  [facility] PGY-2, [place]

## 2015-11-23 DIAGNOSIS — J069 Acute upper respiratory infection, unspecified: Secondary | ICD-10-CM | POA: Insufficient documentation

## 2015-11-23 NOTE — Assessment & Plan Note (Signed)
Patient presenting with cough, previous fevers, myalgias, sinus congestion, and diarrhea, consistent with a viral illness. She continues to have good PO intake. Her lungs are clear and she does not appear to be in any respiratory distress. Discussed with the patient that this is most likely a viral infection given the clinical course and discussed the normal trajectory of this illness. Given her cough has no been improvement with Tessalon perles or guaifenesin, will write an Rx for dextromethorphan. Flonase for nasal symptoms. Discussed taking Tylenol as needed for discomfort. Can continue lozenges for sore throat. D/c Tussin and Mucinex. Discussed RTC precautions such as worsening symptoms or new symptoms.

## 2015-12-10 ENCOUNTER — Telehealth: Payer: Self-pay | Admitting: Family Medicine

## 2015-12-10 DIAGNOSIS — F419 Anxiety disorder, unspecified: Secondary | ICD-10-CM

## 2015-12-10 MED ORDER — ALPRAZOLAM 2 MG PO TABS: 2.0000 mg | ORAL_TABLET | Freq: Three times a day (TID) | ORAL | Status: DC | PRN

## 2015-12-10 NOTE — Telephone Encounter (Signed)
Patient has yet to sign ROI, will be in to come sign.

## 2015-12-10 NOTE — Telephone Encounter (Signed)
Rx called into patient pharmacy. 

## 2015-12-10 NOTE — Telephone Encounter (Signed)
Pt is calling and would like to have a refill on her Xanax and always name brand and medically necessary. jw

## 2015-12-10 NOTE — Telephone Encounter (Signed)
RN staff - please call in Xanax 2 mg TID prn, dispense #90, no refill.  Confirm with patient that she has completed release of information form that was previously placed in the front office. I need this completed so that I can communicate with her counselor at the [facility].

## 2016-01-07 ENCOUNTER — Other Ambulatory Visit: Payer: Self-pay | Admitting: Family Medicine

## 2016-01-07 DIAGNOSIS — F419 Anxiety disorder, unspecified: Secondary | ICD-10-CM

## 2016-01-07 NOTE — Telephone Encounter (Signed)
Pt called and wanted the doctor to know that she needs a refill on her Xanax. Please remember name brand and medically necessary. jw

## 2016-01-08 MED ORDER — ALPRAZOLAM 2 MG PO TABS: 2.0000 mg | ORAL_TABLET | Freq: Three times a day (TID) | ORAL | Status: DC | PRN

## 2016-01-08 NOTE — Telephone Encounter (Signed)
RN staff - please call in Xanax 2 mg TID prn, dispense #90, refill #0, thanks  Please also call patient and inform her that medication was called in. Remind her to come to office to complete ROI form at the front desk (for [facility]). Should be a copy in the box up front.

## 2016-01-08 NOTE — Telephone Encounter (Signed)
Rx called into pharmacy, patient informed. Reminded patient about coming in to sign ROI.

## 2016-01-27 ENCOUNTER — Other Ambulatory Visit: Payer: Self-pay | Admitting: Family Medicine

## 2016-02-06 ENCOUNTER — Other Ambulatory Visit: Payer: Self-pay | Admitting: Family Medicine

## 2016-02-06 DIAGNOSIS — F419 Anxiety disorder, unspecified: Secondary | ICD-10-CM

## 2016-02-06 MED ORDER — ALPRAZOLAM 2 MG PO TABS: 2.0000 mg | ORAL_TABLET | Freq: Three times a day (TID) | ORAL | Status: DC | PRN

## 2016-02-06 NOTE — Telephone Encounter (Signed)
Rx called into patient pharmacy. 

## 2016-02-06 NOTE — Telephone Encounter (Signed)
RN staff - please call in xanax 2 mg TID, dispense #90, refill #0, thanks.  Please also schedule follow up appointment to discuss anxiety medications.

## 2016-02-06 NOTE — Telephone Encounter (Signed)
Pt called and needs a refill on her Xanax left up front. She is due for a refill on 02/07/16. As Always name brand and medically necessary. jw

## 2016-02-06 NOTE — Telephone Encounter (Signed)
Patient called to check on the status of her medication and would like a call when this has been called in.  Please make sure that it is called in medically necessary. Jenna Burke,CMA

## 2016-03-08 ENCOUNTER — Other Ambulatory Visit: Payer: Self-pay | Admitting: Family Medicine

## 2016-03-08 NOTE — Telephone Encounter (Signed)
RN staff - please call in Xanax 2 mg TID prn anxiety, dispense #90, refill #90, please also schedule follow up appointment to discuss anxiety (She will need appointment prior to next refill). Thanks.

## 2016-03-08 NOTE — Telephone Encounter (Signed)
Rx called into patient pharmacy, follow up appointment scheduled for 6/30.

## 2016-03-26 ENCOUNTER — Encounter: Payer: Self-pay | Admitting: Family Medicine

## 2016-03-26 ENCOUNTER — Ambulatory Visit (INDEPENDENT_AMBULATORY_CARE_PROVIDER_SITE_OTHER): Payer: Medicare Other | Admitting: Family Medicine

## 2016-03-26 VITALS — BP 138/88 | HR 98 | Temp 98.5°F | Ht 61.0 in | Wt 167.4 lb

## 2016-03-26 DIAGNOSIS — R51 Headache: Secondary | ICD-10-CM

## 2016-03-26 DIAGNOSIS — F418 Other specified anxiety disorders: Secondary | ICD-10-CM | POA: Diagnosis not present

## 2016-03-26 DIAGNOSIS — F329 Major depressive disorder, single episode, unspecified: Secondary | ICD-10-CM

## 2016-03-26 DIAGNOSIS — R519 Headache, unspecified: Secondary | ICD-10-CM | POA: Insufficient documentation

## 2016-03-26 DIAGNOSIS — G44219 Episodic tension-type headache, not intractable: Secondary | ICD-10-CM

## 2016-03-26 DIAGNOSIS — I10 Essential (primary) hypertension: Secondary | ICD-10-CM | POA: Diagnosis not present

## 2016-03-26 DIAGNOSIS — E785 Hyperlipidemia, unspecified: Secondary | ICD-10-CM | POA: Diagnosis not present

## 2016-03-26 DIAGNOSIS — D489 Neoplasm of uncertain behavior, unspecified: Secondary | ICD-10-CM

## 2016-03-26 DIAGNOSIS — Z0000 Encounter for general adult medical examination without abnormal findings: Secondary | ICD-10-CM | POA: Diagnosis not present

## 2016-03-26 DIAGNOSIS — F419 Anxiety disorder, unspecified: Secondary | ICD-10-CM

## 2016-03-26 NOTE — Assessment & Plan Note (Signed)
Due for recheck of Lipid Profile.  -check lipids -consider change in Simvastatin based on lab results.

## 2016-03-26 NOTE — Assessment & Plan Note (Signed)
Tension type headache related to anxiety/stress. -continue Tylenol prn

## 2016-03-26 NOTE — Assessment & Plan Note (Signed)
Attempted Pap Smear however unable to complete due to patient anxiety/discomfort.  -patient to consider repeat attempt in the future

## 2016-03-26 NOTE — Assessment & Plan Note (Signed)
Blood pressure elevated (likely due to anxiety). Improved ton 138/88. Did not meet criteria to initiate pharmacotherapy. -follow clinically -check basic labs

## 2016-03-26 NOTE — Patient Instructions (Addendum)
It was nice to see you today.  Mammogram -  You are due for a mammogram, I have given you a sheet to schedule this Colonoscopy -  You are due for a colonoscopy, please call Dr. Bertrum Sol office to schedule Address: [address], [place], [place] [address]  Phone: [phone]  Headaches - I think that this is related to stress, may take Tylenol 500 mg (up to 6 tablets per day)  Schedule an appointment to get the skin lesions removed. Schedule an appointment to follow up on back/leg pain.   Anxiety - Dr. Ree Kida will call [facility] to discuss your plan of therapy.   Dr. Ree Kida will call you with the labs.

## 2016-03-26 NOTE — Assessment & Plan Note (Signed)
Uncontrolled. Patient reports more frequent panic. Has not done will follow up with [facility] but has upcoming appointment. -will contact [facility] to discuss changing current regiment of Anafranil and Xanax

## 2016-03-26 NOTE — Progress Notes (Signed)
   Subjective:    Patient ID: Jenna Burke, female    DOB: 1961/02/08, 55 y.o.   MRN: [id number]  HPI 55 y/o female presents for routine follow up.  Anxiety/Depression/OCD - follow with [facility], Horatio Pel CSW, has upcoming appointment on July 14th, still taking Anafranil and Xanax 2 mg TID  Headaches - past 2-3 months, frontal in nature and temples, feel like something "heavy on her head", has been taking Tylenol Extra Strengh (taking at least 4-5 per day), not intactable  Skin Tag/lesion  - left elbow and right breast, both irritated, patient wants removed  HLD - taking Simvastatin, request   HM - due for colonoscopy/mammogram/Hep C/pap smear  Social - nonsmoker  Review of Systems  Constitutional: Negative for fever, chills and fatigue.  Respiratory: Negative for cough and shortness of breath.   Cardiovascular: Negative for chest pain.       Objective:   Physical Exam BP 138/88 mmHg  Pulse 98  Temp(Src) 98.5 F (36.9 C) (Oral)  Ht 5\' 1"  (1.549 m)  Wt 167 lb 6.4 oz (75.932 kg)  BMI 31.65 kg/m2  Gen: pleasant, anxious, female Cardiac: RRR, S1 and S2 present, no murmur Resp: CTAB, normal effort GYN: chaperone present, skintag/condyloma of right labia majora, attempted speculum exam (first with regular speculum and then with pediatric speculum) - unable to complete due to patient anxiety/discomfort/closing legs.  Neuro: CN 2-12 intact, strength grossly 5/5 in all extremities, sensation to light touch intact in all extremities, normal gait  GAD 7 score of 21 (extremely difficult) PHG 9 - score 17 (extremely difficult)      Assessment & Plan:  Anxiety and depression Uncontrolled. Patient reports more frequent panic. Has not done will follow up with [facility] but has upcoming appointment. -will contact [facility] to discuss changing current regiment of Anafranil and Xanax  Hypertension Blood pressure elevated (likely due to anxiety). Improved ton 138/88.  Did not meet criteria to initiate pharmacotherapy. -follow clinically -check basic labs  Hyperlipidemia Due for recheck of Lipid Profile.  -check lipids -consider change in Simvastatin based on lab results.   Neoplasm of uncertain behavior Likely SK of left elbow. Skin tag of right breast. Skin tag vs condyloma of right labia majora. -patient to return for removal of skin lesions.   Headache Tension type headache related to anxiety/stress. -continue Tylenol prn  Preventative health care Attempted Pap Smear however unable to complete due to patient anxiety/discomfort.  -patient to consider repeat attempt in the future

## 2016-03-26 NOTE — Assessment & Plan Note (Signed)
Likely SK of left elbow. Skin tag of right breast. Skin tag vs condyloma of right labia majora. -patient to return for removal of skin lesions.

## 2016-03-27 LAB — COMPLETE METABOLIC PANEL WITH GFR
ALBUMIN: 4.6 g/dL (ref 3.6–5.1)
ALK PHOS: 52 U/L (ref 33–130)
ALT: 19 U/L (ref 6–29)
AST: 18 U/L (ref 10–35)
BILIRUBIN TOTAL: 0.3 mg/dL (ref 0.2–1.2)
BUN: 6 mg/dL — AB (ref 7–25)
CALCIUM: 9.5 mg/dL (ref 8.6–10.4)
CHLORIDE: 102 mmol/L (ref 98–110)
CO2: 23 mmol/L (ref 20–31)
CREATININE: 0.63 mg/dL (ref 0.50–1.05)
GFR, Est Non African American: >89 mL/min (ref >=60)
Glucose, Bld: 92 mg/dL (ref 65–99)
Potassium: 4.1 mmol/L (ref 3.5–5.3)
Sodium: 139 mmol/L (ref 135–146)
Total Protein: 7.4 g/dL (ref 6.1–8.1)

## 2016-03-27 LAB — CBC WITH DIFFERENTIAL/PLATELET
Basophils Absolute: 89 cells/uL (ref 0–200)
Basophils Relative: 1 %
EOS PCT: 1 %
Eosinophils Absolute: 89 cells/uL (ref 15–500)
HEMATOCRIT: 37.4 % (ref 35.0–45.0)
Hemoglobin: 12.5 g/dL (ref 11.7–15.5)
LYMPHS PCT: 28 %
Lymphs Abs: 2492 cells/uL (ref 850–3900)
MCH: 28 pg (ref 27.0–33.0)
MCHC: 33.4 g/dL (ref 32.0–36.0)
MCV: 83.9 fL (ref 80.0–100.0)
MONOS PCT: 8 %
MPV: 11.1 fL (ref 7.5–12.5)
Monocytes Absolute: 712 cells/uL (ref 200–950)
NEUTROS PCT: 62 %
Neutro Abs: 5518 cells/uL (ref 1500–7800)
PLATELETS: 359 10*3/uL (ref 140–400)
RBC: 4.46 MIL/uL (ref 3.80–5.10)
RDW: 14.3 % (ref 11.0–15.0)
WBC: 8.9 10*3/uL (ref 3.8–10.8)

## 2016-03-27 LAB — LIPID PANEL
CHOLESTEROL: 269 mg/dL — AB (ref 125–200)
HDL: 61 mg/dL (ref >=46)
LDL CALC: 171 mg/dL — AB (ref <130)
TRIGLYCERIDES: 186 mg/dL — AB (ref <150)
Total CHOL/HDL Ratio: 4.4 Ratio (ref <=5.0)
VLDL: 37 mg/dL — ABNORMAL HIGH (ref <30)

## 2016-03-27 LAB — HEPATITIS C ANTIBODY: HCV Ab: NEGATIVE

## 2016-03-29 ENCOUNTER — Encounter: Payer: Self-pay | Admitting: Family Medicine

## 2016-04-06 ENCOUNTER — Other Ambulatory Visit: Payer: Self-pay | Admitting: *Deleted

## 2016-04-06 ENCOUNTER — Telehealth: Payer: Self-pay | Admitting: *Deleted

## 2016-04-06 MED ORDER — XANAX 2 MG PO TABS: ORAL_TABLET | ORAL | Status: DC

## 2016-04-06 NOTE — Telephone Encounter (Signed)
RN staff - please call in Xanax 2 mg TID prn anxiety, dispense #90, refill #0, thanks

## 2016-04-06 NOTE — Telephone Encounter (Signed)
[name] from the [facility] called back regarding patient.  States that they have not been in contact since you and her last spoke.  They have closed patient's file with them.  She is available to talk if there are any questions. Jenna Burke,CMA

## 2016-04-06 NOTE — Telephone Encounter (Signed)
Rx called into patient pharmacy. 

## 2016-04-09 ENCOUNTER — Encounter: Payer: Self-pay | Admitting: Family Medicine

## 2016-04-09 ENCOUNTER — Ambulatory Visit (INDEPENDENT_AMBULATORY_CARE_PROVIDER_SITE_OTHER): Payer: Medicare Other | Admitting: Family Medicine

## 2016-04-09 VITALS — Temp 98.4°F | Ht 61.0 in | Wt 168.0 lb

## 2016-04-09 DIAGNOSIS — M5136 Other intervertebral disc degeneration, lumbar region: Secondary | ICD-10-CM

## 2016-04-09 DIAGNOSIS — F410 Panic disorder [episodic paroxysmal anxiety] without agoraphobia: Secondary | ICD-10-CM | POA: Diagnosis not present

## 2016-04-09 DIAGNOSIS — R258 Other abnormal involuntary movements: Secondary | ICD-10-CM | POA: Diagnosis present

## 2016-04-09 DIAGNOSIS — W19XXXA Unspecified fall, initial encounter: Secondary | ICD-10-CM

## 2016-04-09 DIAGNOSIS — F429 Obsessive-compulsive disorder, unspecified: Secondary | ICD-10-CM | POA: Diagnosis not present

## 2016-04-09 DIAGNOSIS — R29818 Other symptoms and signs involving the nervous system: Secondary | ICD-10-CM | POA: Diagnosis not present

## 2016-04-09 DIAGNOSIS — R51 Headache: Secondary | ICD-10-CM

## 2016-04-09 DIAGNOSIS — F4001 Agoraphobia with panic disorder: Secondary | ICD-10-CM | POA: Diagnosis not present

## 2016-04-09 DIAGNOSIS — G894 Chronic pain syndrome: Secondary | ICD-10-CM

## 2016-04-09 DIAGNOSIS — R296 Repeated falls: Secondary | ICD-10-CM | POA: Insufficient documentation

## 2016-04-09 DIAGNOSIS — R519 Headache, unspecified: Secondary | ICD-10-CM

## 2016-04-09 MED ORDER — CYCLOBENZAPRINE HCL 5 MG PO TABS: 5.0000 mg | ORAL_TABLET | Freq: Three times a day (TID) | ORAL | Status: DC | PRN

## 2016-04-09 MED ORDER — SIMVASTATIN 40 MG PO TABS: ORAL_TABLET | ORAL | Status: DC

## 2016-04-09 MED ORDER — PREGABALIN 50 MG PO CAPS: 50.0000 mg | ORAL_CAPSULE | Freq: Three times a day (TID) | ORAL | Status: DC

## 2016-04-09 MED ORDER — MELOXICAM 7.5 MG PO TABS: 7.5000 mg | ORAL_TABLET | Freq: Every day | ORAL | Status: DC

## 2016-04-09 NOTE — Assessment & Plan Note (Addendum)
Multiple recent falls. No associated syncope/LOC. Patient with multiple neurologic symptoms and abnormal neurologic physical exam (hyperreflexis, clonus). -check MRI brain to rule out intracranial mass/demyelinating illness.  -may also be related to high dose Anafranil and Xanax (will need to decrease if MRI negative)

## 2016-04-09 NOTE — Progress Notes (Signed)
   Subjective:    Patient ID: Jenna Burke, female    DOB: 06-06-1961, 55 y.o.   MRN: [id number]  HPI 55 y/o female presents for evaluation of back pain.  Fall - after last visit a few weeks ago, was bending over at home trying to pick something up, fell onto her side, no LOC, did not hit head, has a bruise on her left hip   Back Pain - present for at least one year, reports pain of 10/10, all across the back, has Mobic listed on medication list but not taking, took Extra Strength Tylenol but did not help, no fevers, no urinary incontinence (does report some leakage when she falls).   Headache - continues to have daily headache, see last note for details.   Social - nonsmoker  Review of Systems  Constitutional: Positive for chills. Negative for fever and fatigue.  Respiratory: Negative for shortness of breath.   Cardiovascular: Negative for chest pain.  Gastrointestinal: Negative for nausea, vomiting and diarrhea.  Neurological: Positive for weakness and headaches. Negative for syncope.       Objective:   Physical Exam Temp(Src) 98.4 F (36.9 C) (Oral)  Ht 5\' 1"  (1.549 m)  Wt 168 lb (76.204 kg)  BMI 31.76 kg/m2 Gen: anxious female, NAD Cardiac: RRR, S1 and S2 present, no murmur Resp: CTAB, normal effort MSK: lumber - tenderness bilateral paraspinal and midline, muscle spasm present, negative straight leg test bilaterally; Bilateral Hip - no tenderness with FADER/FABIR; Bilateral Knee - no tenderness, no swelling, lachman/varus stress/valgus stress testing normal with good end point, McMurray no tenderness or clicking Neuro: [id number] intact, strength - Bilateral leg flexion/extension and bilateral hip flexion 5/5, sensation to light touch intact in both legs; bilateral patellar reflexes 3+, bilateral clonus (3-4 beats).  Skin: no rash on back, left hip bruise  Lumbar Xray 2016 IMPRESSION: 1. No fracture or acute finding. 2. Mild levoscoliosis and degenerative changes  MRI  2011 IMPRESSION: Multilevel degenerative disc disease without stenosis. Unchanged right foraminal annular tear at L4-L5 and L5-S1 broad-based disc bulge. Mild lower lumbar facet arthrosis.    Assessment & Plan:  Falls Multiple recent falls. No associated syncope/LOC. Patient with multiple neurologic symptoms and abnormal neurologic physical exam (hyperreflexis, clonus). -check MRI brain to rule out intracranial mass/demyelinating illness.  -may also be related to high dose Anafranil and Xanax (will need to decrease if MRI negative)  Headache Patient continues to have daily headache with associated neurologic symptoms. -check MRI brain  DDD (degenerative disc disease), lumbar Patient presents with low back pain without radicular symptoms. No red flag signs/symptoms for cauda equina syndrome. -trial of Flexeril, Mobic, and Lyrica.

## 2016-04-09 NOTE — Patient Instructions (Signed)
It was nice to see you today.  Back Pain - start Mobic 7.5 mg daily (anti-inflammatory), start Flexeril 5 mg three times per day as needed (muscle-relaxant), and Lyrica 50 mg twice daily (for pain).   I would like to get an MRI of your head. I will call you to schedule.

## 2016-04-09 NOTE — Assessment & Plan Note (Signed)
Patient continues to have daily headache with associated neurologic symptoms. -check MRI brain

## 2016-04-09 NOTE — Assessment & Plan Note (Signed)
Patient presents with low back pain without radicular symptoms. No red flag signs/symptoms for cauda equina syndrome. -trial of Flexeril, Mobic, and Lyrica.

## 2016-04-20 ENCOUNTER — Telehealth: Payer: Self-pay | Admitting: *Deleted

## 2016-04-20 DIAGNOSIS — F429 Obsessive-compulsive disorder, unspecified: Secondary | ICD-10-CM | POA: Diagnosis not present

## 2016-04-20 DIAGNOSIS — M5136 Other intervertebral disc degeneration, lumbar region: Secondary | ICD-10-CM

## 2016-04-20 DIAGNOSIS — F410 Panic disorder [episodic paroxysmal anxiety] without agoraphobia: Secondary | ICD-10-CM | POA: Diagnosis not present

## 2016-04-20 DIAGNOSIS — F4001 Agoraphobia with panic disorder: Secondary | ICD-10-CM | POA: Diagnosis not present

## 2016-04-20 NOTE — Telephone Encounter (Signed)
Attempted to return call, straight to voicemail, left message stating that she can call back if she still has a medication issue.

## 2016-04-20 NOTE — Telephone Encounter (Signed)
Pt calling in wanting you to call her about the medications she is taking. Please advise. Deseree Kennon Holter, CMA

## 2016-04-21 MED ORDER — DICLOFENAC SODIUM 75 MG PO TBEC: 75.0000 mg | DELAYED_RELEASE_TABLET | Freq: Two times a day (BID) | ORAL | 1 refills | Status: DC

## 2016-04-21 NOTE — Telephone Encounter (Signed)
Patient reports "anger" with Mobic. Stopped taking this medication. Back pain somewhat improved with Flexeril and Lyrica. Will prescribe Diclofenac instead to see if this helps with back pain further. Has MRI brain tomorrow.   Psych - saw psychiatrist at [facility], reports that this physician would take her off both Anafranil and Xanax, I told the patient that I would call and discuss with CSW at [facility] (patient verbalized that this was ok, and has signed DPR in the past as well).

## 2016-04-22 ENCOUNTER — Ambulatory Visit (HOSPITAL_COMMUNITY)
Admission: RE | Admit: 2016-04-22 | Discharge: 2016-04-22 | Disposition: A | Discharge status: Home / Self Care | Payer: Medicare Other | Source: Ambulatory Visit | Attending: Family Medicine | Admitting: Family Medicine

## 2016-04-22 DIAGNOSIS — R51 Headache: Secondary | ICD-10-CM | POA: Insufficient documentation

## 2016-04-22 DIAGNOSIS — S0990XA Unspecified injury of head, initial encounter: Secondary | ICD-10-CM | POA: Diagnosis not present

## 2016-04-22 DIAGNOSIS — W19XXXA Unspecified fall, initial encounter: Secondary | ICD-10-CM | POA: Diagnosis not present

## 2016-04-22 MED ORDER — GADOBENATE DIMEGLUMINE 529 MG/ML IV SOLN
15.0000 mL | Freq: Once | INTRAVENOUS | Status: AC
  Administered 2016-04-22: 15 mL via INTRAVENOUS

## 2016-04-26 ENCOUNTER — Other Ambulatory Visit: Payer: Self-pay | Admitting: Family Medicine

## 2016-04-26 ENCOUNTER — Telehealth: Payer: Self-pay | Admitting: Family Medicine

## 2016-04-26 ENCOUNTER — Other Ambulatory Visit: Payer: Self-pay | Admitting: *Deleted

## 2016-04-26 DIAGNOSIS — I739 Peripheral vascular disease, unspecified: Secondary | ICD-10-CM | POA: Insufficient documentation

## 2016-04-26 DIAGNOSIS — IMO0002 Reserved for concepts with insufficient information to code with codable children: Secondary | ICD-10-CM

## 2016-04-26 NOTE — Assessment & Plan Note (Signed)
MRI showed white matter changes consistent with microvascular disease vs demyelinating disease. -placed referral to neurology

## 2016-04-26 NOTE — Telephone Encounter (Signed)
Pt informed. Deseree Blount, CMA  

## 2016-04-26 NOTE — Telephone Encounter (Signed)
Attempted to contact patient with results. No answer. Will attempt to call again later.   If patient returns call please have nursing staff inform that patient that there were some irregular findings on the MRI. She had changes in the white matter which is responsible for sending signals in the brain. I will be placing a referral for her to see the Neurologist.

## 2016-04-26 NOTE — Telephone Encounter (Signed)
Returned patient call and answered questions about MRI results. She reports improved strength in her legs but continues to have low back pain. Sent in refill for flexeril. Patient has upcoming appointment on 8/17. Told her to be seen prior to this if pain worsens or leg symptoms return.

## 2016-04-27 ENCOUNTER — Telehealth: Payer: Self-pay | Admitting: Family Medicine

## 2016-04-27 NOTE — Telephone Encounter (Signed)
Pt got scheduled for the MRI, but it is not until October. Pt stated Dr. Ree Kida wanted her to get one as soon as possible, pt would like [place] to call and see if we could get her in any sooner. Please let pt know. Thanks! ep

## 2016-04-28 NOTE — Telephone Encounter (Signed)
LMOVM for pt to call us back. Jery Hollern, CMA  

## 2016-04-29 NOTE — Telephone Encounter (Signed)
Please check with [facility] to see if they can get her in sooner. Thanks.

## 2016-04-29 NOTE — Telephone Encounter (Signed)
Patient states that [facility] neurology coudnt get her in until 10/10 and patient thought PCP wanted her to be seen before then. I advised patient that I would check with PCP and maybe we could try [facility] to see if they could get her in any sooner. Will forward to PCP.

## 2016-04-30 NOTE — Telephone Encounter (Signed)
Tried [place] again and attempted to call [facility] but was placed on hold too long. Will try again next week. Khamille Beynon Kennon Holter, CMA

## 2016-05-03 NOTE — Telephone Encounter (Signed)
Referral placed in [facility] workqueue, waiting to hear back regarding appointment.

## 2016-05-04 ENCOUNTER — Telehealth: Payer: Self-pay | Admitting: Family Medicine

## 2016-05-04 ENCOUNTER — Other Ambulatory Visit: Payer: Self-pay | Admitting: Family Medicine

## 2016-05-04 ENCOUNTER — Telehealth: Payer: Self-pay | Admitting: *Deleted

## 2016-05-04 MED ORDER — XANAX 2 MG PO TABS: ORAL_TABLET | ORAL | 0 refills | Status: DC

## 2016-05-04 NOTE — Telephone Encounter (Signed)
Needs refill on xanax, brand name medically necessary.

## 2016-05-04 NOTE — Telephone Encounter (Signed)
RN Staff - pleas call in Xanax 2 mg TID prn anxiety, brand name necessary, Dispense #90, no refills

## 2016-05-05 ENCOUNTER — Other Ambulatory Visit: Payer: Self-pay | Admitting: Family Medicine

## 2016-05-05 NOTE — Telephone Encounter (Signed)
Patient called back and states that she spoke with pharmacy and they don't have this new script on file.  Patient would like to know if you can call and check on this since she said she was informed it was called in yesterday. Jarid Sasso,CMA

## 2016-05-10 ENCOUNTER — Other Ambulatory Visit: Payer: Self-pay | Admitting: Family Medicine

## 2016-05-10 MED ORDER — PREGABALIN 50 MG PO CAPS: 50.0000 mg | ORAL_CAPSULE | Freq: Two times a day (BID) | ORAL | 1 refills | Status: DC

## 2016-05-10 NOTE — Telephone Encounter (Signed)
Pt stated she hasn't received an Rx for her legs. Pt does not know what it is called. Please advise. Thanks! ep

## 2016-05-10 NOTE — Telephone Encounter (Signed)
Note to nursing staff - please return patient call. I have refilled flexeril (muslce relaxant). Please also call in Lyrica 50 mg BID, Dispense #60, no refill. Inform patient that this is the medication to help with back/leg issues.

## 2016-05-11 NOTE — Telephone Encounter (Signed)
Called into pharmacy. Attempted to contact pt but no answer. Deseree Kennon Holter, CMA

## 2016-05-13 ENCOUNTER — Encounter: Payer: Self-pay | Admitting: Family Medicine

## 2016-05-13 ENCOUNTER — Ambulatory Visit (INDEPENDENT_AMBULATORY_CARE_PROVIDER_SITE_OTHER): Payer: Medicare Other | Admitting: Family Medicine

## 2016-05-13 DIAGNOSIS — M25561 Pain in right knee: Secondary | ICD-10-CM | POA: Diagnosis not present

## 2016-05-13 DIAGNOSIS — M25562 Pain in left knee: Secondary | ICD-10-CM

## 2016-05-13 DIAGNOSIS — G9349 Other encephalopathy: Secondary | ICD-10-CM | POA: Diagnosis not present

## 2016-05-13 DIAGNOSIS — M170 Bilateral primary osteoarthritis of knee: Secondary | ICD-10-CM | POA: Insufficient documentation

## 2016-05-13 DIAGNOSIS — F419 Anxiety disorder, unspecified: Principal | ICD-10-CM

## 2016-05-13 DIAGNOSIS — M5136 Other intervertebral disc degeneration, lumbar region: Secondary | ICD-10-CM

## 2016-05-13 DIAGNOSIS — F329 Major depressive disorder, single episode, unspecified: Secondary | ICD-10-CM

## 2016-05-13 DIAGNOSIS — F418 Other specified anxiety disorders: Secondary | ICD-10-CM

## 2016-05-13 DIAGNOSIS — F32A Depression, unspecified: Secondary | ICD-10-CM

## 2016-05-13 DIAGNOSIS — IMO0002 Reserved for concepts with insufficient information to code with codable children: Secondary | ICD-10-CM

## 2016-05-13 MED ORDER — PREGABALIN 75 MG PO CAPS: 75.0000 mg | ORAL_CAPSULE | Freq: Three times a day (TID) | ORAL | 2 refills | Status: DC

## 2016-05-13 NOTE — Assessment & Plan Note (Signed)
Reviewed external meds and patient had refills of Lamotrigine. During visit earlier today the patient stated that the [place] had started her on a medication that started with an L. I asked if it was latuda and she answered in the affirmative. However, I suspect that she had actually been started on Lamotrigine (but has not been taking).

## 2016-05-13 NOTE — Progress Notes (Signed)
   Subjective:    Patient ID: SAYYORA KORVER, female    DOB: [birth year], 55 y.o.   MRN: [id number]  HPI 55 y/o female presents for follow up of low back pain.   Back pain: Taking Diclofenac, Flexeril, Lyrica; back pain currently 8/10, still having trembling with legs, no radicular symptoms, no bladder/bowel incontinence  Knee pain - reports "tearing sound" in knees, mostly when she bends down, denies redness/swelling, does locking/clicking with bending down  Mood - recently saw Sherilyn Cooter CSW at [facility], had been seen by psychiatrist and was told to start [place] (did not take as she was afraid to and was not covered by insurance). Does not want to stop Anafranil and Xanax as was counseled to by Psychiatrist at [facility]. Plans to return on 8/24.   Abnormal Neurologic Findings: Patient had hyperreflexia and clonus on exam last visit, MRI showed nonspecific white matter changes, has appointment at [facility] in October, requests something sooner  Social: mother passed away last week  Review of Systems  Constitutional: Positive for appetite change. Negative for chills, fatigue and fever.  Respiratory: Negative for chest tightness and shortness of breath.   Cardiovascular: Negative for chest pain and leg swelling.       Objective:   Physical Exam BP 126/84   Pulse 97   Temp 98.1 F (36.7 C) (Oral)   Ht 5\' 1"  (1.549 m)   Wt 168 lb 9.6 oz (76.5 kg)   LMP  (LMP Unknown)   BMI 31.86 kg/m   Gen: pleasant female, NAD Psych: well dressed, affect is outgoing, mood is depressed, tangential thoughts present Neuro: CN2-12 intact, strength grossly 5/5, sensation to light touch intact in all extremities, 3+ patellar reflexes bilaterally, 2-3+ biceps reflexes bilaterally, 2+ achilles reflexes, 1-2 beats of clonus on bilateral feet, negative rhomberg, negative pronator drift, able to perform finger to nose bilaterally MSK: Bilateral knee - crepitus present, no swelling,  lachman/varus/valgus stress have good endpoints, McMurry negative; Bilateral Hip - FABER/FADIR/log role negative for pain; Lumbar - bilateral and midline tenderness, no swelling  MRI July 2017 IMPRESSION: No acute abnormality. Scattered small white matter hyperintensities are nonspecific. Differential includes chronic microvascular ischemia, migraine headache, and demyelinating disease. Electronically Signed  PHQ 9 score of 8 MDQ score of 4    Assessment & Plan:  DDD (degenerative disc disease), lumbar Symptoms improved with Diclofenac, Flexeril, and Lyrica. No radicular signs/symtpoms today. -increase Lyrica to 75 mg TID -no further refills of Flexeril -continue Diclofenac 75 mg BID  Bilateral knee pain Bilateral knee and crepitus. Exam consistent with OA. Ligament/Meniscus testing negative.  -check xray bilateral knees -continue Diclofenac 75 mg BID  White matter changes Patient has white matter changes on MRI and abnormal exam. -patient has appointment with [facility] on 10/2. RN staff called and scheduled appointment at [facility] next week. Patient unsure if she can make the appointment. Patient to call [place] [facility] [facility] office to reschedule and call [place] to cancel. However if unable to reschedule [facility] appointment she is to keep the [place] appointment.   Anxiety and depression Patient following at [facility]. Recently told to start [place] (stop Anafranil and Xanax) however she elected not to as she was afraid of the side effects.  -encouraged patient to follow up at [place] will contact [facility] to discuss medication regimen

## 2016-05-13 NOTE — Assessment & Plan Note (Signed)
Patient has white matter changes on MRI and abnormal exam. -patient has appointment with [facility] on 10/2. RN staff called and scheduled appointment at [facility] next week. Patient unsure if she can make the appointment. Patient to call [facility] Neuro office to reschedule and call [facility] to cancel. However if unable to reschedule [facility] appointment she is to keep the [facility] appointment.

## 2016-05-13 NOTE — Assessment & Plan Note (Signed)
Symptoms improved with Diclofenac, Flexeril, and Lyrica. No radicular signs/symtpoms today. -increase Lyrica to 75 mg TID -no further refills of Flexeril -continue Diclofenac 75 mg BID

## 2016-05-13 NOTE — Assessment & Plan Note (Addendum)
Patient following at [place]. Recently told to start Latuda (stop Anafranil and Xanax) however she elected not to as she was afraid of the side effects. PHQ 9 score of 9. Mother recently passed away and this is adding to stress.  -encouraged patient to follow up at [place] will contact [place] to discuss medication regimen

## 2016-05-13 NOTE — Assessment & Plan Note (Signed)
Bilateral knee and crepitus. Exam consistent with OA. Ligament/Meniscus testing negative.  -check xray bilateral knees -continue Diclofenac 75 mg BID

## 2016-05-13 NOTE — Patient Instructions (Signed)
It was nice to see you today.  Knee pain - please go to the hospital and get xray's of your knees  Back Pain - continue Diclofenac twice daily, cyclobenzaprine three times per day (I will not be providing refills of this), Increase Lyrica to 75 mg three times per day  Anxiety/mood - Dr. Ree Kida will call the [facility] to discuss your medication regimen.

## 2016-05-17 ENCOUNTER — Encounter: Payer: Self-pay | Admitting: Neurology

## 2016-05-17 ENCOUNTER — Telehealth: Payer: Self-pay | Admitting: Family Medicine

## 2016-05-17 ENCOUNTER — Ambulatory Visit (INDEPENDENT_AMBULATORY_CARE_PROVIDER_SITE_OTHER): Payer: Medicare Other | Admitting: Neurology

## 2016-05-17 ENCOUNTER — Ambulatory Visit: Payer: Self-pay | Admitting: Neurology

## 2016-05-17 ENCOUNTER — Ambulatory Visit (HOSPITAL_COMMUNITY)
Admission: RE | Admit: 2016-05-17 | Discharge: 2016-05-17 | Disposition: A | Discharge status: Home / Self Care | Payer: Medicare Other | Source: Ambulatory Visit | Attending: Family Medicine | Admitting: Family Medicine

## 2016-05-17 ENCOUNTER — Other Ambulatory Visit: Payer: Self-pay | Admitting: Family Medicine

## 2016-05-17 VITALS — BP 115/79 | HR 103 | Ht 61.0 in | Wt 164.2 lb

## 2016-05-17 DIAGNOSIS — M25562 Pain in left knee: Secondary | ICD-10-CM | POA: Diagnosis not present

## 2016-05-17 DIAGNOSIS — F4001 Agoraphobia with panic disorder: Secondary | ICD-10-CM | POA: Diagnosis not present

## 2016-05-17 DIAGNOSIS — G9349 Other encephalopathy: Secondary | ICD-10-CM

## 2016-05-17 DIAGNOSIS — M25561 Pain in right knee: Secondary | ICD-10-CM | POA: Diagnosis not present

## 2016-05-17 DIAGNOSIS — G43709 Chronic migraine without aura, not intractable, without status migrainosus: Secondary | ICD-10-CM

## 2016-05-17 DIAGNOSIS — M179 Osteoarthritis of knee, unspecified: Secondary | ICD-10-CM | POA: Diagnosis not present

## 2016-05-17 DIAGNOSIS — IMO0002 Reserved for concepts with insufficient information to code with codable children: Secondary | ICD-10-CM

## 2016-05-17 DIAGNOSIS — F410 Panic disorder [episodic paroxysmal anxiety] without agoraphobia: Secondary | ICD-10-CM | POA: Diagnosis not present

## 2016-05-17 DIAGNOSIS — M1711 Unilateral primary osteoarthritis, right knee: Secondary | ICD-10-CM | POA: Insufficient documentation

## 2016-05-17 DIAGNOSIS — M170 Bilateral primary osteoarthritis of knee: Secondary | ICD-10-CM

## 2016-05-17 DIAGNOSIS — F429 Obsessive-compulsive disorder, unspecified: Secondary | ICD-10-CM | POA: Diagnosis not present

## 2016-05-17 MED ORDER — SUMATRIPTAN SUCCINATE 25 MG PO TABS: 25.0000 mg | ORAL_TABLET | ORAL | 6 refills | Status: DC | PRN

## 2016-05-17 MED ORDER — PROMETHAZINE HCL 25 MG PO TABS: 25.0000 mg | ORAL_TABLET | Freq: Four times a day (QID) | ORAL | 6 refills | Status: DC | PRN

## 2016-05-17 MED ORDER — PROPRANOLOL HCL 40 MG PO TABS: 40.0000 mg | ORAL_TABLET | Freq: Two times a day (BID) | ORAL | 11 refills | Status: DC

## 2016-05-17 NOTE — Telephone Encounter (Signed)
Called and discussed bilateral knee OA with patient found on xray. Patient to continue Diclofenac. Start to ice knees 2-3 times per day.

## 2016-05-17 NOTE — Telephone Encounter (Signed)
RN staff - please call in Lyrica 75 mg three times daily. Dispense #90, refill #1, thanks.

## 2016-05-17 NOTE — Telephone Encounter (Signed)
Called into pharmacy. Deseree Blount, CMA  
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
## 2016-05-19 ENCOUNTER — Telehealth: Payer: Self-pay | Admitting: Neurology

## 2016-05-19 NOTE — Telephone Encounter (Signed)
Patient called requesting to speak to nurse regarding "why Dr. Krista Blue put her on BP medicine propranolol (INDERAL) 40 MG tablet. Also wants to know what small vessel disease means."

## 2016-05-19 NOTE — Telephone Encounter (Signed)
She is aware that propranolol is being prescribed for migraine prevention.  States Dr. Krista Blue reviewed her MRI her office visit and she understood the explanation.  She just did not realize it was called small vessel disease.

## 2016-05-24 ENCOUNTER — Other Ambulatory Visit: Payer: Self-pay | Admitting: Family Medicine

## 2016-05-24 MED ORDER — PROPRANOLOL HCL ER 80 MG PO CP24: 80.0000 mg | ORAL_CAPSULE | Freq: Every day | ORAL | 3 refills | Status: DC

## 2016-05-24 NOTE — Telephone Encounter (Signed)
Pt agreeable to change - new rx sent to pharmacy.

## 2016-05-24 NOTE — Telephone Encounter (Signed)
Pt says since she has taken the medication she feels like she is going to pass out. Makes her dizzy. Please call and advise [phone]

## 2016-05-24 NOTE — Telephone Encounter (Signed)
Per vo by Dr. Jaynee Eagles, change rx to propranolol ER 80mg , once daily to see if this will reduce the dizziness side effect.

## 2016-05-24 NOTE — Telephone Encounter (Signed)
Refill request for Flexeril 

## 2016-05-25 NOTE — Telephone Encounter (Signed)
The new rx was sent to the pharmacy last night.  She will pick it up and start it tonight.

## 2016-05-25 NOTE — Telephone Encounter (Signed)
Patient called to check status of medication change

## 2016-05-28 ENCOUNTER — Telehealth: Payer: Self-pay | Admitting: Family Medicine

## 2016-05-28 MED ORDER — CYCLOBENZAPRINE HCL 5 MG PO TABS: 5.0000 mg | ORAL_TABLET | Freq: Three times a day (TID) | ORAL | 0 refills | Status: DC | PRN

## 2016-05-28 NOTE — Telephone Encounter (Signed)
Needs refill on cyclovenzarine. Please call pt to let her know if it will be refilled

## 2016-05-28 NOTE — Telephone Encounter (Signed)
RN staff - please notify patient that prescription has been sent. Thanks.

## 2016-06-03 ENCOUNTER — Other Ambulatory Visit: Payer: Self-pay | Admitting: Family Medicine

## 2016-06-03 NOTE — Telephone Encounter (Signed)
Pt would like a refill on Xanax. Pt would also like for someone to call her after it has been called in. Please advise. Thanks! ep

## 2016-06-04 MED ORDER — XANAX 2 MG PO TABS: ORAL_TABLET | ORAL | 0 refills | Status: DC

## 2016-06-04 NOTE — Telephone Encounter (Signed)
Called into pharmacy and pt informed. Sabatino Williard, CMA  

## 2016-06-04 NOTE — Telephone Encounter (Signed)
Forgot to add : Brand name only, medically necessary. Thanks! ep

## 2016-06-04 NOTE — Telephone Encounter (Signed)
Pt called again, wanting a refill on Xanax. Pt would like to be called as soon as it has been called in. Please advise. Thanks! ep

## 2016-06-04 NOTE — Telephone Encounter (Signed)
RN staff - please call in Xanax 2 mg TID prn, dispense #90, refill #0, thanks.

## 2016-06-22 ENCOUNTER — Other Ambulatory Visit: Payer: Self-pay | Admitting: Family Medicine

## 2016-06-22 DIAGNOSIS — M5136 Other intervertebral disc degeneration, lumbar region: Secondary | ICD-10-CM

## 2016-07-02 ENCOUNTER — Other Ambulatory Visit: Payer: Self-pay | Admitting: Family Medicine

## 2016-07-02 MED ORDER — XANAX 2 MG PO TABS: ORAL_TABLET | ORAL | 0 refills | Status: DC

## 2016-07-02 NOTE — Telephone Encounter (Signed)
Pt informed. Pt stated that she already picked it up. Deseree Kennon Holter, CMA

## 2016-07-02 NOTE — Telephone Encounter (Signed)
Pt called and needs a refill on her medication. She needs this to say medically necessary and brand name only. Please call into the pharmacy and also let patient know so that she can get this picked up. jw

## 2016-07-02 NOTE — Addendum Note (Signed)
Addended by: Lupita Dawn on: 07/02/2016 09:49 AM   Modules accepted: Orders

## 2016-07-02 NOTE — Telephone Encounter (Signed)
Nursing staff - please call in Xanax 2 mg TID prn, dispense #90, refill #0, please also schedule anxiety follow up, thanks.

## 2016-07-02 NOTE — Telephone Encounter (Signed)
Printed hard copy of prescription and placed at front desk. RN staff please call patient and make her aware. Thanks.

## 2016-07-02 NOTE — Telephone Encounter (Signed)
Per pharmacy needs to be a hard copy. Gopal Malter Kennon Holter, CMA

## 2016-07-14 ENCOUNTER — Other Ambulatory Visit: Payer: Self-pay | Admitting: Family Medicine

## 2016-07-14 DIAGNOSIS — M5136 Other intervertebral disc degeneration, lumbar region: Secondary | ICD-10-CM

## 2016-07-15 ENCOUNTER — Other Ambulatory Visit: Payer: Self-pay | Admitting: Family Medicine

## 2016-07-20 ENCOUNTER — Other Ambulatory Visit: Payer: Self-pay | Admitting: Family Medicine

## 2016-07-21 NOTE — Telephone Encounter (Signed)
Nursing staff - please call in the following prescriptions. Flexeril 5 mg TID prn muscle spasm. Dispense #60, Refill #0 Lyrica 75 mg TID, Dispense #90, Refill #2

## 2016-07-21 NOTE — Telephone Encounter (Signed)
Rx's called into patient pharmacy.

## 2016-07-30 ENCOUNTER — Other Ambulatory Visit: Payer: Self-pay | Admitting: Family Medicine

## 2016-07-30 MED ORDER — XANAX 2 MG PO TABS: ORAL_TABLET | ORAL | 0 refills | Status: DC

## 2016-07-30 NOTE — Telephone Encounter (Signed)
Nursing staff - please call in Xanax 2 mg TID, dispense #90, no refill, thanks

## 2016-07-30 NOTE — Telephone Encounter (Signed)
[facility]eds [facility]ills on [facility]ax[facility] [address]  Pt wants  to be caled when the prescription is called in

## 2016-07-30 NOTE — Telephone Encounter (Signed)
Rx called into pharmacy and pt informed. Keenan Trefry Kennon Holter, CMA

## 2016-08-14 ENCOUNTER — Other Ambulatory Visit: Payer: Self-pay | Admitting: Family Medicine

## 2016-08-16 ENCOUNTER — Other Ambulatory Visit: Payer: Self-pay | Admitting: Family Medicine

## 2016-08-17 ENCOUNTER — Ambulatory Visit: Payer: Medicare Other | Admitting: Nurse Practitioner

## 2016-08-27 ENCOUNTER — Other Ambulatory Visit: Payer: Self-pay | Admitting: Family Medicine

## 2016-08-27 NOTE — Telephone Encounter (Signed)
Pt needs a refill on Xanax, brand name medically necessary. Pt wants to get this today, says she will run out on Sunday. Pt would like Dr. Nedra Hai nurse to call her. Please advise. Thanks! ep

## 2016-08-29 MED ORDER — XANAX 2 MG PO TABS: ORAL_TABLET | ORAL | 0 refills | Status: DC

## 2016-08-29 NOTE — Telephone Encounter (Signed)
RN staff - please call in Xanax 2 mg TID prn anxiety, dispense #90, no refills. Please notify patient.

## 2016-08-30 NOTE — Telephone Encounter (Signed)
Left message for patient informing her that the Rx had been called into the pharmacy. Nat Christen, CMA

## 2016-08-30 NOTE — Telephone Encounter (Signed)
Called Rx into pharmacy. Nat Christen, CMA

## 2016-09-02 ENCOUNTER — Ambulatory Visit: Payer: Medicare Other | Admitting: Nurse Practitioner

## 2016-09-06 ENCOUNTER — Ambulatory Visit: Payer: Medicare Other | Admitting: Nurse Practitioner

## 2016-09-10 ENCOUNTER — Ambulatory Visit: Payer: Self-pay | Admitting: Family Medicine

## 2016-09-10 NOTE — Progress Notes (Deleted)
   Subjective:    Patient ID: Jenna Burke, female    DOB: 16-Nov-1960, 55 y.o.   MRN: [id number]  HPI 55 y/o female presents for routine follow up.   Chronic Intermittent Headache Seen by Neurology in August to review MRI results. Started on Propranolol ER 80 mg daily. Imitrex prn headaches.   Chronic Small Vessel Disease on MRI Brain Prescribed ASA 81 mg daily and Simvastatin 40 mg daily   Chronic Pain/Fibromyalgia Currently prescribed Flexeril 5 mg TID prn, Voltaren 75 mg BID, and Lyrica 75 mg TID  Bilateral Knee Pain Xrays completed last visit (see below)  Anxiety/Depression/OCD Currently on Anafranil 50 mg (2 capsules AM, 3 capsules in PM) and Xanax 2 mg TID; was supposed to follow up with [facility] after last visit on 05/13/16.   HM Due for mammogram, colonoscopy, pap smear, and flu vaccine  Review of Systems     Objective:   Physical Exam LMP  (LMP Unknown)    Xray left knee (05/17/16) - Tricompartment degenerative change.  Xray right knee (05/17/16) - OA changes on patellofemoral joint     Assessment & Plan:  No problem-specific Assessment & Plan notes found for this encounter.

## 2016-09-12 ENCOUNTER — Other Ambulatory Visit: Payer: Self-pay | Admitting: Neurology

## 2016-09-22 ENCOUNTER — Other Ambulatory Visit: Payer: Self-pay | Admitting: Family Medicine

## 2016-09-28 ENCOUNTER — Telehealth: Payer: Self-pay | Admitting: Family Medicine

## 2016-09-28 NOTE — Telephone Encounter (Signed)
Family Medicine After hours phone call  Patient is calling to ask when our office will be open. She states that she is in need of a medication refill. After inquiring about which medication she needed patient stated that she would not be due for a refill until the fourth and that she would just call back tomorrow if the office was open. I reassured her that we would be open for normal business hours tomorrow morning. Patient thanked me and stated that she had no further questions at this time.  Elberta Leatherwood, MD,MS,  PGY3 09/28/2016 10:37 AM

## 2016-09-29 ENCOUNTER — Telehealth: Payer: Self-pay | Admitting: Family Medicine

## 2016-09-29 ENCOUNTER — Other Ambulatory Visit: Payer: Self-pay | Admitting: Family Medicine

## 2016-09-29 MED ORDER — XANAX 2 MG PO TABS: ORAL_TABLET | ORAL | 0 refills | Status: DC

## 2016-09-29 NOTE — Telephone Encounter (Signed)
Pt called because she is now out of her Xanax. She needs this called in today . Please remember to state name brand only and medically necessary. jw

## 2016-09-29 NOTE — Telephone Encounter (Signed)
RN staff - please call in Xanax 2 mg TID prn anxiety, dispense #45, refill #0. Please tell patient that I only filled enough for her to make it to her appointment on the 11th. I will not fill this again until she is seen in office.

## 2016-09-29 NOTE — Telephone Encounter (Signed)
Spoke with patient and informed her that Rx was called in and was only given enough until appointment on the 11th. Patient expressed understanding. Nat Christen, CMA

## 2016-09-29 NOTE — Telephone Encounter (Signed)
Patient does not have any refills on this prescription, but if refills are given will notate in chart that [facility] [address]. Nat Christen, CMA

## 2016-09-29 NOTE — Telephone Encounter (Signed)
Pt called because we called in her Xanax to the wrong pharmacy location. This [facility]. She did pick up this months prescription but the refills left on this need to be transferred to the [facility] location. jw

## 2016-10-05 ENCOUNTER — Encounter: Payer: Self-pay | Admitting: Nurse Practitioner

## 2016-10-05 ENCOUNTER — Ambulatory Visit (INDEPENDENT_AMBULATORY_CARE_PROVIDER_SITE_OTHER): Payer: Medicare Other | Admitting: Nurse Practitioner

## 2016-10-05 VITALS — BP 133/83 | HR 76 | Ht 61.0 in | Wt 167.0 lb

## 2016-10-05 DIAGNOSIS — G44219 Episodic tension-type headache, not intractable: Secondary | ICD-10-CM

## 2016-10-05 DIAGNOSIS — R930 Abnormal findings on diagnostic imaging of skull and head, not elsewhere classified: Secondary | ICD-10-CM

## 2016-10-05 MED ORDER — TOPIRAMATE 25 MG PO TABS: 25.0000 mg | ORAL_TABLET | Freq: Every day | ORAL | 3 refills | Status: DC

## 2016-10-05 NOTE — Patient Instructions (Addendum)
Continue aspirin 0.81 daily Continue Inderal LA 80 mg daily Continue Imitrex acutely Topamax 25 mg by mouth at bedtime for 1 week then increase to 2 capsules at bedtime Follow-up in 3 months
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
## 2016-10-06 ENCOUNTER — Encounter: Payer: Self-pay | Admitting: Family Medicine
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
## 2016-10-07 ENCOUNTER — Ambulatory Visit (INDEPENDENT_AMBULATORY_CARE_PROVIDER_SITE_OTHER): Payer: Medicare Other | Admitting: Family Medicine

## 2016-10-07 ENCOUNTER — Encounter: Payer: Self-pay | Admitting: Family Medicine

## 2016-10-07 DIAGNOSIS — F419 Anxiety disorder, unspecified: Secondary | ICD-10-CM

## 2016-10-07 DIAGNOSIS — I999 Unspecified disorder of circulatory system: Secondary | ICD-10-CM

## 2016-10-07 DIAGNOSIS — F329 Major depressive disorder, single episode, unspecified: Secondary | ICD-10-CM

## 2016-10-07 DIAGNOSIS — I739 Peripheral vascular disease, unspecified: Secondary | ICD-10-CM

## 2016-10-07 DIAGNOSIS — E785 Hyperlipidemia, unspecified: Secondary | ICD-10-CM

## 2016-10-07 DIAGNOSIS — F418 Other specified anxiety disorders: Secondary | ICD-10-CM

## 2016-10-07 DIAGNOSIS — G44219 Episodic tension-type headache, not intractable: Secondary | ICD-10-CM | POA: Diagnosis not present

## 2016-10-07 DIAGNOSIS — F32A Depression, unspecified: Secondary | ICD-10-CM

## 2016-10-07 MED ORDER — XANAX 2 MG PO TABS: ORAL_TABLET | ORAL | 0 refills | Status: DC

## 2016-10-07 MED ORDER — ATORVASTATIN CALCIUM 40 MG PO TABS: 40.0000 mg | ORAL_TABLET | Freq: Every day | ORAL | 2 refills | Status: DC

## 2016-10-07 NOTE — Assessment & Plan Note (Signed)
Transitioned Simvastatin to Lipitor 40 given small vessel disease on MRI.

## 2016-10-07 NOTE — Assessment & Plan Note (Signed)
Still has 1-2 migraines per week. Currently taking Propranolol LA 80 mg daily and prn Imitrex. Did not tolerate recent addition of Topamax. -encouraged patient to return to neurology for follow up.

## 2016-10-07 NOTE — Patient Instructions (Addendum)
It was nice to see you today.  Please continue to take Aspirin 81 mg daily.  Go back to the Neurologist to follow up on your headaches.  I have provided a 2 week supply of your Xanax. I am going to get records from the [facility] and discuss your medications with them.   Please get your Mammogram.

## 2016-10-07 NOTE — Assessment & Plan Note (Signed)
Continue aspirin 81 mg daily. Transition Simvastatin to Lipitor 40 mg daily.

## 2016-10-07 NOTE — Assessment & Plan Note (Signed)
Stable on Anafranil and Xanax. Counseled patient that I wish to have [place] prescribe psychiatric medications. -will get records from [place] -will reach out to [facility] to discuss patient medications

## 2016-10-08 NOTE — Progress Notes (Signed)
I have reviewed and agreed above plan. 

## 2016-10-10 ENCOUNTER — Other Ambulatory Visit: Payer: Self-pay | Admitting: Family Medicine

## 2016-10-10 ENCOUNTER — Other Ambulatory Visit: Payer: Self-pay | Admitting: Neurology

## 2016-10-10 DIAGNOSIS — M5136 Other intervertebral disc degeneration, lumbar region: Secondary | ICD-10-CM

## 2016-10-19 ENCOUNTER — Ambulatory Visit (INDEPENDENT_AMBULATORY_CARE_PROVIDER_SITE_OTHER): Payer: Medicare Other | Admitting: Family Medicine

## 2016-10-19 ENCOUNTER — Encounter: Payer: Self-pay | Admitting: Family Medicine

## 2016-10-19 ENCOUNTER — Ambulatory Visit
Admission: RE | Admit: 2016-10-19 | Discharge: 2016-10-19 | Disposition: A | Discharge status: Home / Self Care | Payer: Medicare Other | Source: Ambulatory Visit | Attending: Family Medicine | Admitting: Family Medicine

## 2016-10-19 VITALS — BP 116/82 | HR 76 | Temp 97.6°F | Ht 61.0 in | Wt 171.6 lb

## 2016-10-19 DIAGNOSIS — M5441 Lumbago with sciatica, right side: Secondary | ICD-10-CM | POA: Diagnosis not present

## 2016-10-19 DIAGNOSIS — M5442 Lumbago with sciatica, left side: Secondary | ICD-10-CM | POA: Diagnosis present

## 2016-10-19 DIAGNOSIS — M47816 Spondylosis without myelopathy or radiculopathy, lumbar region: Secondary | ICD-10-CM | POA: Diagnosis not present

## 2016-10-19 MED ORDER — MELOXICAM 15 MG PO TABS: 15.0000 mg | ORAL_TABLET | Freq: Every day | ORAL | 0 refills | Status: DC
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
## 2016-10-19 NOTE — Patient Instructions (Addendum)
I have prescribed Mobic, you take it once daily.  Stop taking Diclofenac on this medication.  Do no take Ibuprofen, naproxen, Aleve, or Advil either. You can continue to take Tylenol and Flexeril as needed. Go get the X-ray. If your symptoms worsen or fail to improve, please follow up with [place] or seek care.  Follow up in clinic on Thursday or Friday.   Sciatica Sciatica is pain, numbness, weakness, or tingling along the path of the sciatic nerve. The sciatic nerve starts in the lower back and runs down the back of each leg. The nerve controls the muscles in the lower leg and in the back of the knee. It also provides feeling (sensation) to the back of the thigh, the lower leg, and the sole of the foot. Sciatica is a symptom of another medical condition that pinches or puts pressure on the sciatic nerve. Generally, sciatica only affects one side of the body. Sciatica usually goes away on its own or with treatment. In some cases, sciatica may keep coming back (recur). What are the causes? This condition is caused by pressure on the sciatic nerve, or pinching of the sciatic nerve. This may be the result of:  A disk in between the bones of the spine (vertebrae) bulging out too far (herniated disk).  Age-related changes in the spinal disks (degenerative disk disease).  A pain disorder that affects a muscle in the buttock (piriformis syndrome).  Extra bone growth (bone spur) near the sciatic nerve.  An injury or break (fracture) of the pelvis.  Pregnancy.  Tumor (rare). What increases the risk? The following factors may make you more likely to develop this condition:  Playing sports that place pressure or stress on the spine, such as football or weight lifting.  Having poor strength and flexibility.  A history of back injury.  A history of back surgery.  Sitting for long periods of time.  Doing activities that involve repetitive bending or lifting.  Obesity. What are the signs or  symptoms? Symptoms can vary from mild to very severe, and they may include:  Any of these problems in the lower back, leg, hip, or buttock:  Mild tingling or dull aches.  Burning sensations.  Sharp pains.  Numbness in the back of the calf or the sole of the foot.  Leg weakness.  Severe back pain that makes movement difficult. These symptoms may get worse when you cough, sneeze, or laugh, or when you sit or stand for long periods of time. Being overweight may also make symptoms worse. In some cases, symptoms may recur over time. How is this diagnosed? This condition may be diagnosed based on:  Your symptoms.  A physical exam. Your health care provider may ask you to do certain movements to check whether those movements trigger your symptoms.  You may have tests, including:  Blood tests.  X-rays.  MRI.  CT scan. How is this treated? In many cases, this condition improves on its own, without any treatment. However, treatment may include:  Reducing or modifying physical activity during periods of pain.  Exercising and stretching to strengthen your abdomen and improve the flexibility of your spine.  Icing and applying heat to the affected area.  Medicines that help:  To relieve pain and swelling.  To relax your muscles.  Injections of medicines that help to relieve pain, irritation, and inflammation around the sciatic nerve (steroids).  Surgery. Follow these instructions at home: Medicines  Take over-the-counter and prescription medicines only as told  by your health care provider.  Do not drive or operate heavy machinery while taking prescription pain medicine. Managing pain  If directed, apply ice to the affected area.  Put ice in a plastic bag.  Place a towel between your skin and the bag.  Leave the ice on for 20 minutes, 2-3 times a day.  After icing, apply heat to the affected area before you exercise or as often as told by your health care provider.  Use the heat source that your health care provider recommends, such as a moist heat pack or a heating pad.  Place a towel between your skin and the heat source.  Leave the heat on for 20-30 minutes.  Remove the heat if your skin turns bright red. This is especially important if you are unable to feel pain, heat, or cold. You may have a greater risk of getting burned. Activity  Return to your normal activities as told by your health care provider. Ask your health care provider what activities are safe for you.  Avoid activities that make your symptoms worse.  Take brief periods of rest throughout the day. Resting in a lying or standing position is usually better than sitting to rest.  When you rest for longer periods, mix in some mild activity or stretching between periods of rest. This will help to prevent stiffness and pain.  Avoid sitting for long periods of time without moving. Get up and move around at least one time each hour.  Exercise and stretch regularly, as told by your health care provider.  Do not lift anything that is heavier than 10 lb (4.5 kg) while you have symptoms of sciatica. When you do not have symptoms, you should still avoid heavy lifting, especially repetitive heavy lifting.  When you lift objects, always use proper lifting technique, which includes:  Bending your knees.  Keeping the load close to your body.  Avoiding twisting. General instructions  Use good posture.  Avoid leaning forward while sitting.  Avoid hunching over while standing.  Maintain a healthy weight. Excess weight puts extra stress on your back and makes it difficult to maintain good posture.  Wear supportive, comfortable shoes. Avoid wearing high heels.  Avoid sleeping on a mattress that is too soft or too hard. A mattress that is firm enough to support your back when you sleep may help to reduce your pain.  Keep all follow-up visits as told by your health care provider. This is  important. Contact a health care provider if:  You have pain that wakes you up when you are sleeping.  You have pain that gets worse when you lie down.  Your pain is worse than you have experienced in the past.  Your pain lasts longer than 4 weeks.  You experience unexplained weight loss. Get help right away if:  You lose control of your bowel or bladder (incontinence).  You have:  Weakness in your lower back, pelvis, buttocks, or legs that gets worse.  Redness or swelling of your back.  A burning sensation when you urinate. This information is not intended to replace advice given to you by your health care provider. Make sure you discuss any questions you have with your health care provider. Document Released: 09/07/2001 Document Revised: 02/17/2016 Document Reviewed: 05/23/2015 Elsevier Interactive Patient Education  2017 [employer].

## 2016-10-19 NOTE — Progress Notes (Signed)
Subjective: CC: back pain HPI: Patient is a 56 y.o. female with a past medical history of DDD, fibromyalgia, anxiety, OCD  presenting to clinic today for a SDA for back pain.  BACK PAIN **Of note, patient is a poor historian, often unable to answer questions appropriately, even when her sister who accompanied her attempts to put her back on track and remind her of events.   Back pain began 6 days ago. Pain is in the lower back diffusely.  Pain is described as stabbing, 10/10.  Pain radiates: Around to the back and down the anterior thighs bilaterally; stops at the knee. She cannot wipe herself after using the bathroom because of the bending movement which makes the pain worse.  There's some confusion on whether she fell recently, however sister notes she fell 2 days b/c she couldn't get to a chair in the bedroom. The pt cannot tell me if she injured herself at all. It seems like maybe all her symptoms occurred after this fall.   Patient has tried Tylenol q 8hrs and Flexeril TID; not helping. She may take Diclofenac once per day but states she doesn't take it like she should as she's riding around in the car often and doesn't eat all day.  She uses ice packs 20 minutes TID without improvement. Hasn't tried heat.   Prior history of similar pain: yes (but not as severe in the back), was more severe in her legs at that time (also after a fall).  History of cancer: no  Weak immune system:  No  History of IV drug use: no  History of steroid use: no  Not using her can currently.   Symptoms Incontinence of bowel or bladder:  No, 1 episode of urinary incontinence due to being unable to get to bathroom in time.  Numbness of leg: no  Fever: no  Rest or Night pain: no Weight Loss:  no Rash: no IV drug use: no   Social History: former smoker  Flu Vaccine: up to date    ROS: All other systems reviewed and are negative.  Past Medical History Patient Active Problem List   Diagnosis  Date Noted  . Lumbar pain 10/19/2016  . Bilateral primary osteoarthritis of knee 05/13/2016  . Small vessel disease 04/26/2016  . Falls 04/09/2016  . DDD (degenerative disc disease), lumbar 04/09/2016  . Hypertension 03/26/2016  . Neoplasm of uncertain behavior [id number]  . Headache 03/26/2016  . Chronic pain syndrome 11/13/2015  . Hot flashes, menopausal 02/25/2015  . Hyperlipidemia 09/04/2014  . Preventative health care 09/03/2014  . Anal itching 04/10/2014  . Mallet deformity of fifth finger, left, acquired 10/26/2012  . SYMPTOMATIC MENOPAUSAL/FEMALE CLIMACTERIC STATES 09/15/2010  . Fibromyalgia 09/30/2009  . Obesity 11/24/2006  . Anxiety and depression 11/24/2006  . Obsessive-compulsive disorder 11/24/2006  . Rosacea 11/24/2006  . CERVICAL SPINE DISORDER, NOS 11/24/2006    Medications- reviewed and updated  Objective: Office vital signs reviewed. BP 116/82 (BP Location: Right Arm, Patient Position: Sitting, Cuff Size: Normal)   Pulse 76   Temp 97.6 F (36.4 C) (Oral)   Ht 5\' 1"  (1.549 m)   Wt 171 lb 9.6 oz (77.8 kg)   LMP  (Exact Date)   SpO2 95%   BMI 32.42 kg/m    Physical Examination:  General: Awake, alert, well- nourished, NAD Cardio: RRR, no m/r/g noted.  Pulm: No increased WOB.  CTAB, without wheezes, rhonchi or crackles noted.  Back: normal to inspection. No swelling, abrasions, ecchymoses.  Tenderness in the lumbar region over the midline, paraspinal muscles bilaterally, and in the flanks bilaterally. Notes pain with flexion of back, improvement in extension. 4+/5 strength in the LEs bilaterally. Sensation grossly intact bilaterally. 2/4 DTRs. Negative SLR.    Assessment/Plan: Lumbar pain Patient presenting with lumbar pain in the setting of concerns for recent inciting trauma. Not on chronic steroids and no h/o known cancer, but given h/o DJD and symptom onset, would like to r/o acute fracture. No red flags on exam or history for concerns such as cauda  equina. Pt is not an IV drug user and no fevers concerning for infection.  - will get x-ray of the lumbar spine - will d/c diclofenac given inconsistent use, will start Mobic daily. - continue Tylenol and Flexeril PRN. - discussed red flags to pt and sister. - pt to f/u on Thursday or Friday.  - pt may benefit from PT (specificaly those trained in [place]).     Orders Placed This Encounter  Procedures  . DG Lumbar Spine Complete    Standing Status:   Future    Number of Occurrences:   1    Standing Expiration Date:   12/17/2017    Order Specific Question:   Reason for Exam (SYMPTOM  OR DIAGNOSIS REQUIRED)    Answer:   lumbar pain x 1 week wtih bilateral sciatica s/p fall    Order Specific Question:   Is patient pregnant?    Answer:   No    Order Specific Question:   Preferred imaging location?    Answer:   [facility]    Meds ordered this encounter  Medications  . meloxicam (MOBIC) 15 MG tablet    Sig: Take 1 tablet (15 mg total) by mouth daily. For 10 days, then daily as needed for pain    Dispense:  30 tablet    Refill:  [place] PGY-3, [place]

## 2016-10-21 ENCOUNTER — Telehealth: Payer: Self-pay | Admitting: Family Medicine

## 2016-10-21 NOTE — Telephone Encounter (Signed)
Attempted to call patient to let her know that there was no fracture or dislocation noted on her X-ray. No answer and voicemail not left as there was no identifying message to ensure we have the correct number on file.  Please attempt to contact the pt at a later time to relay this information. She should keep her follow up appt.  Thanks, Archie Patten, MD [facility] Resident  10/21/2016, 8:30 AM

## 2016-10-21 NOTE — Telephone Encounter (Signed)
Left message on voicemail for patient to return call. 

## 2016-10-22 ENCOUNTER — Ambulatory Visit (INDEPENDENT_AMBULATORY_CARE_PROVIDER_SITE_OTHER): Payer: Medicare Other | Admitting: Family Medicine

## 2016-10-22 ENCOUNTER — Telehealth: Payer: Self-pay | Admitting: *Deleted

## 2016-10-22 ENCOUNTER — Encounter: Payer: Self-pay | Admitting: Family Medicine

## 2016-10-22 ENCOUNTER — Ambulatory Visit (HOSPITAL_COMMUNITY)
Admission: RE | Admit: 2016-10-22 | Discharge: 2016-10-22 | Disposition: A | Discharge status: Home / Self Care | Payer: Medicare Other | Source: Ambulatory Visit | Attending: Family Medicine | Admitting: Family Medicine

## 2016-10-22 DIAGNOSIS — M5441 Lumbago with sciatica, right side: Secondary | ICD-10-CM | POA: Diagnosis not present

## 2016-10-22 DIAGNOSIS — M5136 Other intervertebral disc degeneration, lumbar region: Secondary | ICD-10-CM | POA: Insufficient documentation

## 2016-10-22 DIAGNOSIS — M545 Low back pain: Secondary | ICD-10-CM | POA: Diagnosis not present

## 2016-10-22 NOTE — Telephone Encounter (Signed)
Opal Sidles from [facility] called a stat report for MRI of lumbar spine. Impression No cause of the presenting symptoms is identified. No significant change since the study of 2011. Mild degenerative disc disease and degenerative facet disease without stenosis or neural compression.  Will forward to PCP.  Derl Barrow, RN

## 2016-10-22 NOTE — Patient Instructions (Signed)
I have ordered an MRI of your back. Please have this done immediately.   Have the radiology department page me with the results:  [phone]

## 2016-10-22 NOTE — Progress Notes (Signed)
   Subjective:    Patient ID: Jenna Burke, female    DOB: [birth year], 56 y.o.   MRN: [id number]  HPI 56 y/o female presents for follow up of low back pain. Seen on 1/23 by Dr. Lorenso Courier and had xray and prescribed Mobic. Reviewed Dr. Libby Maw note.  No improvement with multiple medications including Flexeril, tylenol, Mobic, and Ibuprofen. Taking Lyrica. Also no relief with heat/ice. She reports both new bladder and bowel incontinence (able to control most times but is not able to hold for long), some radiation of symptoms to posterior thigh on right side, pain is 10/10  Patient reports falling a week ago, fell on some ice while walking to the mailbox, fell on back/butt multiple times. .   Review of Systems See above    Objective:   Physical Exam BP (!) 154/88   Pulse 81   Temp 97.8 F (36.6 C) (Oral)   Ht 5\' 1"  (1.549 m)   Wt 168 lb 9.6 oz (76.5 kg)   SpO2 100%   BMI 31.86 kg/m   Gen: pleasant female, in distress due to pain MSK: lumbar - diffuse midline and paraspinal tenderness, no palpable step-offs. SLT positive with right leg raise.  Neuro: Strength 5/5 in bilateral legs (hip flexion, knee flexion, plantar flexion), sensation to light touch intact 2+ bilateral patellar reflexes Rectal: poor tone with bearing down  MRI 2011 IMPRESSION: Multilevel degenerative disc disease without stenosis.  Unchanged right foraminal annular tear at L4-L5 and L5-S1 broad-based disc bulge.  Mild lower lumbar facet arthrosis.  Xray 10/19/16 IMPRESSION: No acute fracture or subluxation. Degenerative changes as described above most significant at L5-S1 level.    Assessment & Plan:  DDD (degenerative disc disease), lumbar Follow up for low back pain with radicular symptoms. Now with bladder and bowel incontinence. Poor tone on exam.  -STAT MRI lumbar spine to rule out cauda equina syndrome -Toradol 60 mg X1 given in office

## 2016-10-22 NOTE — Assessment & Plan Note (Addendum)
Follow up for low back pain with radicular symptoms. Now with bladder and bowel incontinence. Poor tone on exam.  -STAT MRI lumbar spine to rule out cauda equina syndrome -Toradol 60 mg X1 given in office

## 2016-10-22 NOTE — Telephone Encounter (Signed)
Called patient with MRI results. No acute pathology. Continue Mobic, Lyrica, and Flexeril.

## 2016-10-24 ENCOUNTER — Other Ambulatory Visit: Payer: Self-pay | Admitting: Family Medicine

## 2016-10-25 MED ORDER — KETOROLAC TROMETHAMINE 60 MG/2ML IM SOLN
60.0000 mg | Freq: Once | INTRAMUSCULAR | Status: AC
  Administered 2016-10-22: 60 mg via INTRAMUSCULAR

## 2016-10-25 NOTE — Addendum Note (Signed)
Addended by: Junious Dresser on: 10/25/2016 08:26 AM   Modules accepted: Orders

## 2016-10-27 ENCOUNTER — Other Ambulatory Visit: Payer: Self-pay | Admitting: Family Medicine

## 2016-10-28 ENCOUNTER — Telehealth: Payer: Self-pay | Admitting: Family Medicine

## 2016-10-28 ENCOUNTER — Other Ambulatory Visit: Payer: Self-pay | Admitting: Family Medicine

## 2016-10-28 MED ORDER — XANAX 2 MG PO TABS: ORAL_TABLET | ORAL | 0 refills | Status: DC

## 2016-10-28 NOTE — Telephone Encounter (Signed)
RN staff - please call in Xanax 2 mg TID, dispense #90, no refill. Please inform patient.

## 2016-10-28 NOTE — Telephone Encounter (Signed)
Pt states she is unable to pick up refill because last month the Rx was split 45 and 45. Pt states she needs a letter to take to the pharmacy or for [place] to call them and let them know she needs to pick up Rx on the 3rd. Pt wants Dr. Ree Kida to call her. ep

## 2016-10-28 NOTE — Telephone Encounter (Signed)
Rx called into patient pharmacy. 

## 2016-10-28 NOTE — Telephone Encounter (Signed)
RN staff - please call in Lyrica 75 mg TID, dispense #90, 5 refills, thanks

## 2016-10-28 NOTE — Telephone Encounter (Signed)
Pt wants a refill on Xanax, name brand only. Pt would like Dr. Ree Kida or Dese[facility] to call her. ep

## 2016-11-01 ENCOUNTER — Encounter: Payer: Self-pay | Admitting: Family Medicine

## 2016-11-01 DIAGNOSIS — F3181 Bipolar II disorder: Secondary | ICD-10-CM

## 2016-11-01 DIAGNOSIS — F4001 Agoraphobia with panic disorder: Secondary | ICD-10-CM | POA: Insufficient documentation

## 2016-11-01 NOTE — Progress Notes (Signed)
Reviewed Notes from [place]  04/09/16 - met with Horatio Pel LCSW for Intake Exam and counseling 04/20/16 - Dx. Of likely bipolar type 2 and Panic Disorder with agoraphobia. Started on Lamictal (patient never took). See by Dr. Elane Fritz.  05/17/16 - met with Horatio Pel LCSW for counseling

## 2016-11-05 ENCOUNTER — Other Ambulatory Visit: Payer: Self-pay | Admitting: Neurology

## 2016-11-09 ENCOUNTER — Ambulatory Visit (INDEPENDENT_AMBULATORY_CARE_PROVIDER_SITE_OTHER): Payer: Medicare Other | Admitting: Family Medicine

## 2016-11-09 VITALS — BP 143/90 | Temp 98.4°F | Wt 172.6 lb

## 2016-11-09 DIAGNOSIS — Z23 Encounter for immunization: Secondary | ICD-10-CM | POA: Diagnosis not present

## 2016-11-09 DIAGNOSIS — M5136 Other intervertebral disc degeneration, lumbar region: Secondary | ICD-10-CM

## 2016-11-09 DIAGNOSIS — N39498 Other specified urinary incontinence: Secondary | ICD-10-CM | POA: Diagnosis not present

## 2016-11-09 DIAGNOSIS — M545 Low back pain, unspecified: Secondary | ICD-10-CM

## 2016-11-09 LAB — POCT UA - MICROSCOPIC ONLY

## 2016-11-09 LAB — POCT URINALYSIS DIPSTICK
BILIRUBIN UA: NEGATIVE
Blood, UA: NEGATIVE
Glucose, UA: NEGATIVE
KETONES UA: NEGATIVE
Nitrite, UA: NEGATIVE
Protein, UA: NEGATIVE
Urobilinogen, UA: 0.2
pH, UA: 7

## 2016-11-09 MED ORDER — KETOROLAC TROMETHAMINE 30 MG/ML IJ SOLN
30.0000 mg | Freq: Once | INTRAMUSCULAR | Status: AC
  Administered 2016-11-09: 30 mg via INTRAMUSCULAR

## 2016-11-09 MED ORDER — CYCLOBENZAPRINE HCL 5 MG PO TABS: ORAL_TABLET | ORAL | 0 refills | Status: DC

## 2016-11-09 MED ORDER — METHYLPREDNISOLONE ACETATE 40 MG/ML IJ SUSP
40.0000 mg | Freq: Once | INTRAMUSCULAR | Status: AC
  Administered 2016-11-09: 40 mg via INTRAMUSCULAR

## 2016-11-09 NOTE — Patient Instructions (Signed)
Thank you so much for coming to visit today! I have sent a referral to physical therapy for your low back pain. Please continue Mobic. I have refilled your Flexeril.  Please try the Kegal exercises given.  If no improvement following physical therapy, please follow up with your PCP.  Dr. Gerlean Ren

## 2016-11-10 NOTE — Assessment & Plan Note (Signed)
MRI unchanged. Given pain has not improved with medical management, will refer to physical therapy. Will give shot of Toradol and Depo today. Flexeril refilled. Urinary and Fecal incontinence does not seem to be related to compression given normal MRI--Kegal exercises given and will monitor for improvement with improvement of pain. Urinalysis not impressive. Follow up with PCP.

## 2016-11-10 NOTE — Progress Notes (Signed)
Subjective:     Patient ID: Jenna Burke, female   DOB: Dec 17, 1960, 56 y.o.   MRN: [id number]  HPI Jenna Burke is a 56yo female presenting today to follow up low back pain. Sister aided patient with history. Initially seen on 10/19/16, where she reported 6day history of back pain after slipping and falling on ice multiple times. Was again seen on 1/26 with little change, but reported new urinary and fecal incontinence. Urgent MRI of lumbar spine was obtained and showed no cause of presenting symptoms--no change in mild degenerative disc disease and degenerative facet disease, no stenosis or neural compression noted. Continues to note little change since the last office visit. Pain is located over low back with some radiation laterally over paraspinal muscles, but no radiation to lower extremities. Has been using a cane to ambulate. Continues to note fecal and urinary incontinence. Denies dysuria. Denies numbness in lower extremities, including saddle area. Reports some improvement with Lyrica. Unsure if she is still taking Mobic. Is out of Flexeril, but states this helped when she had it. Noted some improvement with Toradol injection given at last office visit on 1/26.  Former Smoker.  Review of Systems Per HPI    Objective:   Physical Exam  Constitutional: She appears well-developed and well-nourished. No distress.  Cardiovascular: Normal rate and regular rhythm.   No murmur heard. Pulmonary/Chest: Effort normal. No respiratory distress. She has no wheezes.  Abdominal: Soft. She exhibits no distension. There is no tenderness.  No CVA tenderness noted  Musculoskeletal:  Tenderness over midline of lumbar spine with some extension bilaterally. + Straight leg raise bilaterally  Neurological:  Sensation intact over bilateral upper and lower extremities  Psychiatric:  Pressured speech. Tangential.      Assessment and Plan:     DDD (degenerative disc disease), lumbar MRI unchanged. Given  pain has not improved with medical management, will refer to physical therapy. Will give shot of Toradol and Depo today. Flexeril refilled. Urinary and Fecal incontinence does not seem to be related to compression given normal MRI--Kegal exercises given and will monitor for improvement with improvement of pain. Urinalysis not impressive. Follow up with PCP.

## 2016-11-17 ENCOUNTER — Other Ambulatory Visit: Payer: Self-pay | Admitting: *Deleted

## 2016-11-17 MED ORDER — MELOXICAM 15 MG PO TABS: 15.0000 mg | ORAL_TABLET | Freq: Every day | ORAL | 2 refills | Status: DC

## 2016-11-22 ENCOUNTER — Other Ambulatory Visit: Payer: Self-pay | Admitting: Family Medicine

## 2016-11-22 ENCOUNTER — Telehealth: Payer: Self-pay | Admitting: Family Medicine

## 2016-11-22 DIAGNOSIS — F329 Major depressive disorder, single episode, unspecified: Secondary | ICD-10-CM

## 2016-11-22 DIAGNOSIS — F419 Anxiety disorder, unspecified: Principal | ICD-10-CM

## 2016-11-22 MED ORDER — ALPRAZOLAM 1 MG PO TABS: ORAL_TABLET | ORAL | 0 refills | Status: DC

## 2016-11-22 NOTE — Telephone Encounter (Signed)
Nursing staff - please call in Xanax 1 mg tablets; Take 2 mg (2 tablets) in the morning, take 1 mg (1 tablet) at lunch, and take 2 mg (2 tablets) at night. Dispense #150, refill #0

## 2016-11-22 NOTE — Telephone Encounter (Signed)
Called patient to discuss Xanax refill and Anxiety/Depression. Patient has still not make a follow up appointment with [place] (last appointment 04/2016). Informed patient that I will be decreasing her dose of Xanax as was discussed at previous visit. Encouraged her to make a follow up appointment with [place].

## 2016-11-22 NOTE — Telephone Encounter (Signed)
I informed patient someone would call her regarding this as soon as we can.

## 2016-11-22 NOTE — Assessment & Plan Note (Signed)
Called patient to discuss Xanax refill and Anxiety/Depression. Patient has still not make a follow up appointment with [place] (last appointment 04/2016). Informed patient that I will be decreasing her dose of Xanax as was discussed at previous visit. Encouraged her to make a follow up appointment with [place].

## 2016-11-22 NOTE — Telephone Encounter (Signed)
Rx called into pharmacy. Deseree Kennon Holter, CMA

## 2016-11-22 NOTE — Telephone Encounter (Signed)
Pt needs a refill on Xanax name brand only, medically necessary. Pt would like Deseree to call her today. ep

## 2016-11-22 NOTE — Telephone Encounter (Signed)
She needs her Xanax 2 mg refilled.  She is almost out.  Can we refill today?  Patient is in waiting room.  Thanks/ls

## 2016-11-25 ENCOUNTER — Other Ambulatory Visit: Payer: Self-pay | Admitting: Family Medicine

## 2016-12-01 NOTE — Progress Notes (Deleted)
   Subjective:    Patient ID: Jenna Burke, female    DOB: 20-Feb-1961, 56 y.o.   MRN: [id number]  HPI 56 y/o female presents for follow up of back pain.  Lumbar back pain Initially evaluated on 10/22/16. Prescribed Mobic and Flexeril. MRI negative for acute lumbar issues (orederd to evaluate for fecal and urinary incontinence). Followed up on 11/09/16. Pain unchanged. Given Toradol and DepoMedrol in office. Referred to PT. Prescribed Kegel exercises for urinary incontinence.   Anxiety/OCD/Bipolar 2 Xanax decreased on 11/22/16 to 2 mg AM, 1 mg at lunch, 2 mg in PM. Patient also prescribed Anafranil. Was told to make appointment at [facility]. Reviewed [facility] notes on 11/01/16 (see documentation note). Last appointment at [facility] was 05/17/16.   HM Due for mammogram, colonoscopy, pap smear (pap smear attempted 2017 however patient unable to tolerate procedure).    Review of Systems     Objective:   Physical Exam  There were no vitals taken for this visit.   MRI Lumbar 10/22/16 No cause of the presenting symptoms is identified. No significant change since the study of 2011. Mild degenerative disc disease and degenerative facet disease without stenosis or neural compression.      Assessment & Plan:  No problem-specific Assessment & Plan notes found for this encounter.

## 2016-12-03 ENCOUNTER — Ambulatory Visit: Payer: Medicare Other | Admitting: Family Medicine

## 2016-12-14 NOTE — Progress Notes (Signed)
   Subjective:    Patient ID: LAVONIA EAGER, female    DOB: 26-Apr-1961, 56 y.o.   MRN: [id number]  HPI 56 y/o female presents for follow up of back pain and anxiety.  Back Pain Seen for multiple visits in January and February of this year. Last sen on 11/09/16. Given shot of Toradol and Depomedrol in office. Prescribed Flexeril and Lyrica. Pain is improved, less "giving out"; taking Mobic prn; pain currently 5/10, some radiation to buttock, no leg numbness/weakness.   Urinary/Fecal incontinence Associated with low back injury. MRI negative for acute cause. Reports improvement in both, no further fecal incontinence, occasional leakage of urine with bending over, no incontinence with cough/sneeze.  Bipolar/OCD Initiated Xanax taper on 11/22/16, currently prescribed Xanax 2 mg in AM, 1 mg at lunch, and 2 mg at night. Previously counseled multiple times to follow up with behavioral health at [place] (has not follow up at [facility]). Patient reports "more tired" with change in dose, no other side effects  HM Due for Mammogram, Colonoscopy, and Pap Smear (not able to tolerate previously)  Headaches Started on Topamax 25 mg daily by Neurology (did not go up to 50 mg as prescribed). Reports continued headaches. Also prescribed Inderal LA 80 mg daily and PRN Imitrex.   Review of Systems No fevers, no chills, no nausea, no emesis    Objective:   Physical Exam BP 128/86   Pulse 77   Temp 98.1 F (36.7 C) (Oral)   Ht 5\' 1"  (1.549 m)   Wt 173 lb 3.2 oz (78.6 kg)   SpO2 98%   BMI 32.73 kg/m   Gen: pleasant female, NAD Cardiac: RRR, S1 and S2 present, no murmur Resp: CTAB, normal effort Neuro: CN 2-12 intact, strength 5/5 in bilateral lower extremities, sensation to light touch intact in both LE, 2+ patellar reflexes bilaterally Psych: well groomed, affect is outgoing, some flight if ideas      Assessment & Plan:  DDD (degenerative disc disease), lumbar Improved from last  visit. No fed flag signs/symptoms. Incontinence improved.  -continue home Flexeril, Lyrica, and prn Mobic  Stress incontinence Occasional incontinence most consistent with stress induced. -continue kegal exercises  Bipolar 2 disorder (HCC) Stable. Patient has yet to follow up with [facility] strongly encouraged her to follow up with her counselor Horatio Pel at [facility] and re-establish care with psychiatry (at [facility]) -continue to wean Xanax (new dose 2 mg in AM, 1 mg in afternoon, 1 mg at night)  Headache Patient has continued headaches. Not taking Topamax as prescribed (only taking once daily) -encouraged patient to take as prescribed (two 25 mg tablets at night) and return to Neurology

## 2016-12-16 ENCOUNTER — Encounter: Payer: Self-pay | Admitting: Family Medicine

## 2016-12-16 ENCOUNTER — Ambulatory Visit (INDEPENDENT_AMBULATORY_CARE_PROVIDER_SITE_OTHER): Payer: Medicare Other | Admitting: Family Medicine

## 2016-12-16 DIAGNOSIS — N393 Stress incontinence (female) (male): Secondary | ICD-10-CM | POA: Diagnosis not present

## 2016-12-16 DIAGNOSIS — M5136 Other intervertebral disc degeneration, lumbar region: Secondary | ICD-10-CM | POA: Diagnosis not present

## 2016-12-16 DIAGNOSIS — G44219 Episodic tension-type headache, not intractable: Secondary | ICD-10-CM | POA: Diagnosis not present

## 2016-12-16 DIAGNOSIS — F3181 Bipolar II disorder: Secondary | ICD-10-CM | POA: Diagnosis not present

## 2016-12-16 MED ORDER — ALPRAZOLAM 1 MG PO TABS: ORAL_TABLET | ORAL | 0 refills | Status: DC

## 2016-12-16 NOTE — Patient Instructions (Signed)
It was nice to see you today.  Back pain - continue Flexeril and Lyrica.  Xanax - wean to 2 mg at breakfast, 1 mg at lunch, and 1 mg in the evening.   Please return in one month for follow up of Xanax.

## 2016-12-17 DIAGNOSIS — N393 Stress incontinence (female) (male): Secondary | ICD-10-CM | POA: Insufficient documentation

## 2016-12-17 NOTE — Assessment & Plan Note (Signed)
Occasional incontinence most consistent with stress induced. -continue kegal exercises

## 2016-12-17 NOTE — Assessment & Plan Note (Signed)
Improved from last visit. No fed flag signs/symptoms. Incontinence improved.  -continue home Flexeril, Lyrica, and prn Mobic

## 2016-12-17 NOTE — Assessment & Plan Note (Signed)
Stable. Patient has yet to follow up with [facility] strongly encouraged her to follow up with her counselor Horatio Pel at [facility] and re-establish care with psychiatry (at [place]) -continue to wean Xanax (new dose 2 mg in AM, 1 mg in afternoon, 1 mg at night)

## 2016-12-17 NOTE — Assessment & Plan Note (Signed)
Patient has continued headaches. Not taking Topamax as prescribed (only taking once daily) -encouraged patient to take as prescribed (two 25 mg tablets at night) and return to Neurology

## 2016-12-20 ENCOUNTER — Other Ambulatory Visit: Payer: Self-pay | Admitting: Family Medicine

## 2016-12-20 NOTE — Telephone Encounter (Signed)
RN staff - I provided the patient with a paper prescription for Xanax last week. Please call the pharmacy and verify that the Xanax is "brand name only". Thanks.

## 2016-12-20 NOTE — Telephone Encounter (Signed)
Called RX into pharmacy. Pt informed and given instructions to give pharmacy rx at pick up. Jenna Burke, CMA

## 2016-12-20 NOTE — Telephone Encounter (Signed)
Pt  calling to request refill of:  Name of Medication(s):  Xanax Name Marca Ancona Only-Pt is out and wants this today Last date of OV:  12-16-16 Pharmacy:    Will route refill request to Clinic RN.  Discussed with patient policy to call pharmacy for future refills.  Also, discussed refills may take up to 48 hours to approve or deny.  Renella Cunas

## 2016-12-22 ENCOUNTER — Encounter: Payer: Self-pay | Admitting: Family Medicine

## 2016-12-22 ENCOUNTER — Ambulatory Visit (INDEPENDENT_AMBULATORY_CARE_PROVIDER_SITE_OTHER): Payer: Medicare Other | Admitting: Family Medicine

## 2016-12-22 DIAGNOSIS — L732 Hidradenitis suppurativa: Secondary | ICD-10-CM

## 2016-12-22 MED ORDER — DOXYCYCLINE HYCLATE 100 MG PO TABS: 100.0000 mg | ORAL_TABLET | Freq: Two times a day (BID) | ORAL | 0 refills | Status: DC

## 2016-12-22 NOTE — Progress Notes (Signed)
   HPI  CC: groin abscesses Patient is here for 2 groin abscesses. She states that she first noticed these developing approximately 2 weeks ago. The began to grow in size and become more painful as time went on. As of sometime last week he began to drain. She denies any extension of the pain beyond the borders of these nodules. Both are located along the right medial aspect of the proximal thigh. She denies any trauma to this area. No expanding areas of erythema. No significant bleeding. Patient has had similar abscesses in the past. She states that she has required incision and drainage of previous abscesses in the past. Other episodes have been successfully treated with antibiotics. He denies any systemic symptoms. No nausea, vomiting, diarrhea, fever, chills, headache, blurred vision, chest pain, or shortness of breath. She denies any vaginal discharge or bleeding.  Review of Systems See HPI for ROS.   CC, SH/smoking status, and VS noted  Objective: BP 138/84   Pulse 85   Temp 98.4 F (36.9 C) (Oral)   Wt 176 lb (79.8 kg)   BMI 33.25 kg/m  Gen: NAD, alert, cooperative, anxious appearing CV: Well-perfused Resp: non-labored Integument: Proximal medial right thigh/groin with 2 areas of tender induration (approx. 1.5cm diameter) and mild erythema. No active drainage noted, but obvious signs of previous drainage. Erythema and tenderness is localized only to the site of the nodules and does not extend toward the vagina, hip, or down the leg.   Assessment and plan:  Hidradenitis suppurativa Patient presents today with 2 relatively small abscesses in her right groin. This is likely a flare of her HS. no red flag symptoms, systemic symptoms, or evidence of infection expanding beyond the borders of the identified abscesses. On exam both lesions appeared to be actively draining or had already drained. - Doxycycline twice a day 10 days - Encouraged warm compresses - Discussed keeping this area  clean and dry; using thinner cotton clothing and powders as necessary. - Follow-up as needed   Meds ordered this encounter  Medications  . doxycycline (VIBRA-TABS) 100 MG tablet    Sig: Take 1 tablet (100 mg total) by mouth 2 (two) times daily.    Dispense:  20 tablet    Refill:  0     Elberta Leatherwood, MD,MS,  PGY3 12/22/2016 2:01 PM

## 2016-12-22 NOTE — Assessment & Plan Note (Signed)
Patient presents today with 2 relatively small abscesses in her right groin. This is likely a flare of her HS. no red flag symptoms, systemic symptoms, or evidence of infection expanding beyond the borders of the identified abscesses. On exam both lesions appeared to be actively draining or had already drained. - Doxycycline twice a day 10 days - Encouraged warm compresses - Discussed keeping this area clean and dry; using thinner cotton clothing and powders as necessary. - Follow-up as needed

## 2016-12-22 NOTE — Patient Instructions (Addendum)
It was a pleasure seeing you today in our clinic. Today we discussed your groin abscesses. Here is the treatment plan we have discussed and agreed upon together:   - I prescribed doxycycline. Take 1 tablet twice a day for the next 10 days. - When able, apply warm, damp washcloths to these areas. - Try to use one layer of cotton clothing to keep this area clean and dry without getting too hot or sweaty. Keeping this area relatively dry is going to be extremely helpful when trying to heal up these wounds.

## 2016-12-29 ENCOUNTER — Other Ambulatory Visit: Payer: Self-pay | Admitting: Family Medicine

## 2016-12-29 ENCOUNTER — Other Ambulatory Visit: Payer: Self-pay | Admitting: Neurology

## 2016-12-29 ENCOUNTER — Other Ambulatory Visit: Payer: Self-pay | Admitting: Nurse Practitioner

## 2016-12-29 DIAGNOSIS — E785 Hyperlipidemia, unspecified: Secondary | ICD-10-CM

## 2016-12-29 NOTE — Telephone Encounter (Signed)
Spoke with patient and inquired if she was taking topiramate. Advised her that her PCP's note from Jan stated it caused her dizziness, so she stopped taking it. Patient stated her PCP misunderstood her about topiramate. Patient stated she was reading off bottle of medication. She read topiramate 25 mg. She stated she is taking two tablets every night. Patient stated she does need refill, confirmed [facility] [address] Patient then inquired who she is seeing 01/04/17 for follow up. Advised she is seeing Daun Peacock, NP. Patient stated she wanted to see Dr Krista Blue. Advised this RN would send her request to Dr Rhea Belton RN, and she will get a call back. Patient verbalized understanding, appreciation.

## 2016-12-29 NOTE — Telephone Encounter (Signed)
Called patient's home phone and sister answered. She stated she would have patient call back in a few minutes.

## 2017-01-04 ENCOUNTER — Ambulatory Visit: Payer: Medicare Other | Admitting: Nurse Practitioner

## 2017-01-06 ENCOUNTER — Other Ambulatory Visit: Payer: Self-pay | Admitting: Family Medicine

## 2017-01-10 NOTE — Progress Notes (Signed)
   Subjective:    Patient ID: Jenna Burke, female    DOB: 02/13/1961, 56 y.o.   MRN: [id number]  HPI 56 y/o female presents for evaluation of left hip/leg pain.   Leg Pain Prescribed daily Flexeril and Lyrica, also takes prn Mobic, primarily over lateral left leg, present since fall a few months ago, no radiation to groin  Bipolar 2 Disorder Xanax weaned to 2 mg in AM, 1 mg in PM, and 1 mg at night at last visit. Has not returned to the [facility]. States she didn't like the psychiatrist that was there.   Left Elbow pain Present from fall, lateral aspect, no redness/swelling  Social Non-smoker   Review of Systems  Constitutional: Negative for chills, fatigue and fever.  Respiratory: Negative for shortness of breath.   Cardiovascular: Negative for chest pain.       Objective:   Physical Exam BP 140/86   Pulse 71   Temp 97.5 F (36.4 C) (Oral)   Ht 5\' 1"  (1.549 m)   Wt 174 lb 9.6 oz (79.2 kg)   SpO2 99%   BMI 32.99 kg/m    Gen: pleasant female, NAD MSK: left hip-lateral hip tenderness over greater trochanter, no pain with FABER/FADIR; left elbow - tenderness over lateral epicondyle, some tenderness with resisted wrist extension, ROM to flexion/extension/pronation/supination is full, strength to elbow flexion/extension 5/5   MRI Lumbar 09/2016 IMPRESSION: No cause of the presenting symptoms is identified. No significant change since the study of 2011. Mild degenerative disc disease and degenerative facet disease without stenosis or neural compression.  Xray Left Knee 04/2016 IMPRESSION: Tricompartment degenerative change.  No acute abnormality.  Xray Right Knee 04/2016 IMPRESSION: Osteoarthritic change in the patellofemoral joint medially. No fracture or joint effusion. No erosive change.  Procedure Note: Greater Trochanter Injection  Written and verbal consent obtained. Discussed the risks and benefits of the procedure. Left lateral hip/thight prepped in  sterile fashion. Using a 25 gauge 1.5 inch needle 40 mg DepoMedrol and 4 cc Lidocaine without epinephrine was injected in the greater trochanteric space. Patient tolerated well. No complications. Hemostasis achieved. Bandage applied.         Assessment & Plan:  Left elbow pain Suspect lateral epicondylitis. Perhaps overuse injury conpensating for back pain after fall a few months ago. -check xray given previous fall -encouraged icing daily and to continue Mobic  Greater trochanteric bursitis, left Left lateral hip pain consistent with greater trochanteric bursitis.  -steroid injection provided today (see procedure note)  Bipolar 2 disorder ([place]) Patient still has not followed up with [facility].  -encouraged follow up with [facility], counseled on importance of proper therapy/treatment -refilled Xanax 1 mg tabs (2 mg in AM, 1 mg at lunch, 1 mg at dinner), may take as 2 mg BID if she wishes, patient will likely benefit from further weaning of xanax at follow up visits however would like her to at least get re-established with psychiatrist at [facility]

## 2017-01-13 ENCOUNTER — Telehealth: Payer: Self-pay | Admitting: Neurology

## 2017-01-13 ENCOUNTER — Encounter: Payer: Self-pay | Admitting: Neurology

## 2017-01-13 ENCOUNTER — Ambulatory Visit (INDEPENDENT_AMBULATORY_CARE_PROVIDER_SITE_OTHER): Payer: Medicare Other | Admitting: Neurology

## 2017-01-13 VITALS — BP 122/80 | HR 72 | Ht 61.0 in | Wt 170.0 lb

## 2017-01-13 DIAGNOSIS — I999 Unspecified disorder of circulatory system: Secondary | ICD-10-CM

## 2017-01-13 DIAGNOSIS — IMO0002 Reserved for concepts with insufficient information to code with codable children: Secondary | ICD-10-CM

## 2017-01-13 DIAGNOSIS — F3181 Bipolar II disorder: Secondary | ICD-10-CM

## 2017-01-13 DIAGNOSIS — I739 Peripheral vascular disease, unspecified: Secondary | ICD-10-CM

## 2017-01-13 DIAGNOSIS — G43709 Chronic migraine without aura, not intractable, without status migrainosus: Secondary | ICD-10-CM | POA: Diagnosis not present

## 2017-01-13 MED ORDER — PROPRANOLOL HCL ER 80 MG PO CP24: 80.0000 mg | ORAL_CAPSULE | Freq: Every day | ORAL | 4 refills | Status: DC

## 2017-01-13 MED ORDER — TOPIRAMATE 25 MG PO TABS
50.0000 mg | ORAL_TABLET | Freq: Every day | ORAL | 4 refills | Status: AC
Start: 2017-01-13 — End: ?

## 2017-01-13 NOTE — Telephone Encounter (Signed)
Pt calling re: Propranolol HCl 80 mg & Topiramate she checked with the pharmacy and was told they have not received the order yet.  Pt was advised to f/u with the pharmacy later today and tomorrow as well.  She would like a call to confirm order has been sent

## 2017-01-13 NOTE — Telephone Encounter (Signed)
Spoke to [place] at [facility] [phone]) - she confirmed both prescriptions have been received by our office.  I have returned the call to the patient - left message letting her know this information.
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
## 2017-01-14 ENCOUNTER — Ambulatory Visit (HOSPITAL_COMMUNITY)
Admission: RE | Admit: 2017-01-14 | Discharge: 2017-01-14 | Disposition: A | Discharge status: Home / Self Care | Payer: Medicare Other | Source: Ambulatory Visit | Attending: Family Medicine | Admitting: Family Medicine

## 2017-01-14 ENCOUNTER — Ambulatory Visit (INDEPENDENT_AMBULATORY_CARE_PROVIDER_SITE_OTHER): Payer: Medicare Other | Admitting: Family Medicine

## 2017-01-14 ENCOUNTER — Encounter: Payer: Self-pay | Admitting: Family Medicine

## 2017-01-14 DIAGNOSIS — M25522 Pain in left elbow: Secondary | ICD-10-CM

## 2017-01-14 DIAGNOSIS — M7062 Trochanteric bursitis, left hip: Secondary | ICD-10-CM | POA: Diagnosis not present

## 2017-01-14 DIAGNOSIS — R937 Abnormal findings on diagnostic imaging of other parts of musculoskeletal system: Secondary | ICD-10-CM | POA: Insufficient documentation

## 2017-01-14 DIAGNOSIS — F3181 Bipolar II disorder: Secondary | ICD-10-CM

## 2017-01-14 MED ORDER — ALPRAZOLAM 1 MG PO TABS: ORAL_TABLET | ORAL | 0 refills | Status: DC

## 2017-01-14 MED ORDER — DOXYCYCLINE HYCLATE 100 MG PO TABS: 100.0000 mg | ORAL_TABLET | Freq: Two times a day (BID) | ORAL | 0 refills | Status: DC

## 2017-01-14 MED ORDER — METHYLPREDNISOLONE ACETATE 40 MG/ML IJ SUSP
40.0000 mg | Freq: Once | INTRAMUSCULAR | Status: AC
  Administered 2017-01-14: 40 mg via INTRAMUSCULAR

## 2017-01-14 NOTE — Patient Instructions (Signed)
It was nice to see you today.  Left Hip Pain - like inflammation over your greater trochanteric bursa. Steroid injection today.  Left Elbow Pain - likely overuse injury, get xrays today given your fall, ice your elbow twice daily for 15 minutes. Continue Mobic daily.  Anxiety/OCD - refilled Xanax today, please re-establish care at the [facility].

## 2017-01-14 NOTE — Addendum Note (Signed)
Addended by: Junious Dresser on: 01/14/2017 01:34 PM   Modules accepted: Orders

## 2017-01-14 NOTE — Assessment & Plan Note (Signed)
Suspect lateral epicondylitis. Perhaps overuse injury conpensating for back pain after fall a few months ago. -check xray given previous fall -encouraged icing daily and to continue Mobic

## 2017-01-14 NOTE — Assessment & Plan Note (Signed)
Left lateral hip pain consistent with greater trochanteric bursitis.  -steroid injection provided today (see procedure note)

## 2017-01-14 NOTE — Assessment & Plan Note (Signed)
Patient still has not followed up with [facility].  -encouraged follow up with [facility], counseled on importance of proper therapy/treatment -refilled Xanax 1 mg tabs (2 mg in AM, 1 mg at lunch, 1 mg at dinner), may take as 2 mg BID if she wishes, patient will likely benefit from further weaning of xanax at follow up visits however would like her to at least get re-established with psychiatrist at [facility]

## 2017-01-17 ENCOUNTER — Encounter: Payer: Self-pay | Admitting: Family Medicine

## 2017-01-19 ENCOUNTER — Other Ambulatory Visit: Payer: Self-pay | Admitting: Family Medicine

## 2017-01-29 ENCOUNTER — Other Ambulatory Visit: Payer: Self-pay | Admitting: Family Medicine

## 2017-02-03 ENCOUNTER — Other Ambulatory Visit: Payer: Self-pay | Admitting: Family Medicine

## 2017-02-09 NOTE — Progress Notes (Deleted)
   Subjective:    Patient ID: Jenna Burke, female    DOB: Sep 07, 1961, 56 y.o.   MRN: [id number]  HPI 56 y/o female presents for follow up of left hip/low back pain.  Left hip pain Prescribed daily Flexeril and Lyrica, also takes prn Mobic, primarily over lateral left leg; received greater trochanter injection at last visit on 01/14/17.   Left elbow pain Thought to be lateral epicondylitis at visit on 4/20, xray negative for acute fracture.   Bipolar 2 Disorder Currently prescribed Anafranil 100 mg in AM and 150 mg in PM; also takes Xanax (2 mg in AM, 1 mg at lunch, and 1 mg at dinner). Patient has been reluctant to reestablish care at [facility].   HM Due for Colonoscopy, Mammogram, and Pap Smear (previously unable to tolerate pap smear in [facility])   Review of Systems     Objective:   Physical Exam There were no vitals taken for this visit.  Xray Left Elbow 01/14/17 IMPRESSION: 1. No acute abnormality identified.  No evidence of effusion.  2. Tiny bony density noted adjacent to the coronoid process of the ulna could represent a tiny loose body.     Assessment & Plan:  No problem-specific Assessment & Plan notes found for this encounter.

## 2017-02-11 ENCOUNTER — Ambulatory Visit: Payer: Self-pay | Admitting: Family Medicine

## 2017-02-16 ENCOUNTER — Other Ambulatory Visit: Payer: Self-pay | Admitting: Family Medicine

## 2017-02-16 DIAGNOSIS — E785 Hyperlipidemia, unspecified: Secondary | ICD-10-CM

## 2017-02-16 DIAGNOSIS — L290 Pruritus ani: Secondary | ICD-10-CM

## 2017-02-16 NOTE — Telephone Encounter (Signed)
Pt states she needs a refill on everything. [facility] [address] Pt also needs her Xanax refilled, name brand medically necessary. ep

## 2017-02-17 ENCOUNTER — Telehealth: Payer: Self-pay | Admitting: Family Medicine

## 2017-02-17 DIAGNOSIS — F3181 Bipolar II disorder: Secondary | ICD-10-CM

## 2017-02-17 MED ORDER — ALPRAZOLAM 1 MG PO TABS: ORAL_TABLET | ORAL | 0 refills | Status: DC

## 2017-02-17 NOTE — Telephone Encounter (Signed)
Called into pharmacy and pt informed. Deseree Kennon Holter, CMA

## 2017-02-17 NOTE — Telephone Encounter (Signed)
Reviewed previous notes. Patient was actually supposed to be weaned down to Xanax 1 mg (2 tabs AM, 1 tab PM, and 1 tab Evening). However, per recent prescription she was prescribed 2 tabs AM, 1 tab PM, and 2 tabs PM. If taking as prescribed she would have ran out after 24 days. Since nurse already called in prescription today and notified patient will change. However will update Xanax to correct dose.

## 2017-02-17 NOTE — Telephone Encounter (Signed)
Spoke with patient. She needs refill of Xanax. Out for a few days. Last refilled on 4/28 (only given 120 tablets which would provide 24 days) States that she may have taken more than she was prescribed (due to generalized anxiety/fears). She has not re-established care with the [facility] as we discussed at last appointment. She plan to go today/tomorrow.   RN staff - please call in refill of Xanax 1 mg (2 tablets in AM, 1 tablet at lunch, 2 tablets at night). Dispense #150, no refills. Please notify the patient.

## 2017-02-17 NOTE — Addendum Note (Signed)
Addended by: Lupita Dawn on: 02/17/2017 04:27 PM   Modules accepted: Orders

## 2017-02-17 NOTE — Telephone Encounter (Signed)
Pt called about her xanax refilled.  She is almost out and really needs them. She would like to talk to dr fletke also.

## 2017-02-17 NOTE — Assessment & Plan Note (Signed)
See note from 5/24. Prescribed 2 tabs in AM, 1 tab at lunch, and 2 tabs at night based on prescription not being up to date. Should be on 2 tabs AM, 1 at lunch, 1 at dinner of 1 mg Xanax. Patient yet to reestablish with [facility].

## 2017-02-18 MED ORDER — FLUTICASONE PROPIONATE 50 MCG/ACT NA SUSP: 2.0000 | Freq: Every day | NASAL | 6 refills | Status: AC

## 2017-02-18 MED ORDER — OMEPRAZOLE 20 MG PO CPDR: 20.0000 mg | DELAYED_RELEASE_CAPSULE | Freq: Two times a day (BID) | ORAL | 1 refills | Status: AC

## 2017-02-18 MED ORDER — ASPIRIN EC 81 MG PO TBEC: 81.0000 mg | DELAYED_RELEASE_TABLET | Freq: Every day | ORAL | 1 refills | Status: DC

## 2017-02-18 NOTE — Telephone Encounter (Signed)
I have reviewed the patients medications and refilled those that are due.

## 2017-02-23 NOTE — Progress Notes (Deleted)
   Subjective:    Patient ID: Jenna Burke, female    DOB: [birth year], 56 y.o.   MRN: [id number]  HPI 56 y/o female presents for routine follow up.  Anxiety/Bipolar  Left Lateral Hip Pain Patient received greater trochanteric steroid injection at last visit on 01/14/17. Also takes Mobic, Flexeril, and Lyrica.    HM Due for mammogram, colonoscopy, and pap smear.    Review of Systems     Objective:   Physical Exam There were no vitals taken for this visit.        Assessment & Plan:  No problem-specific Assessment & Plan notes found for this encounter.

## 2017-02-24 ENCOUNTER — Ambulatory Visit: Payer: Self-pay | Admitting: Family Medicine

## 2017-03-09 NOTE — Progress Notes (Addendum)
   Subjective:    Patient ID: Jenna Burke, female    DOB: [birth year], 56 y.o.   MRN: [id number]  HPI 56 y/o female presents for follow up of chronic pain and Bipolar Type 2.  Bipolar Type 2/OCD Last prescription for Xanax was for 2 mg in AM, 1 mg at lunch, and 2 mg at dinner. Plan today is to wean to 2/1/1. Patient is supposed to follow with the [facility]. [facility]. Does not have an upcoming appointment.   Left leg pain/Greater Trochanteric Bursitis Given steroid injection at last visit, also prescribed Flexeril/Lyrica/Mobic for chronic pain. Patient reports improvement in leg pain.   Low back pain Reports low back pain, all the way across the back, feels like "giving out", puts ice on back which helps some  Left elbow pain Suspected lateral epicondylitis at last visit. Xray negative for fracture. Pain has resolved.   Boil in leg Was able to get some blood drainage out of it, present for days  Toenail fungus Patient requests medication  Social Nonsmoker   Review of Systems See above.     Objective:   Physical Exam BP 118/78   Pulse 92   Temp 98.3 F (36.8 C) (Oral)   Ht 5\' 1"  (1.549 m)   Wt 180 lb 9.6 oz (81.9 kg)   SpO2 95%   BMI 34.12 kg/m    Gen: anxious female, NAD Cardiac: RRR, S1 and S2 present, no murmur Resp: CTAB, normal effort GU: chaperone present, boil right inner groin (looks like it has spontaneously drained) MSK: diffuse lumbar tenderness and muscle spasm, SLT negative bilaterally Neuro: strength 5/5 BLE and sensation to light touch intact, 2+ patellar reflexes bilaterally, normal gait Skin: onychomycosis of all 10 toes  MRI Lumbar Spine January 2018 IMPRESSION: No cause of the presenting symptoms is identified. No significant change since the study of 2011. Mild degenerative disc disease and degenerative facet disease without stenosis or neural compression.     Assessment & Plan:  DDD (degenerative disc  disease), lumbar Exam consistent with muscle spasm. MRI in 09/2016 showed mild DDD.  -continue Lyrica, Mobic, and Flexeril -provided home exercises -encouraged heat to affected areas  Greater trochanteric bursitis, left Pain resolved s/p steroid injection at last visit.   Left elbow pain Resolved from last visit.   Hidradenitis suppurativa New boil of right inner groin. -10 day course of Doxycycline  Bipolar 2 disorder ([place]) Patient strongly encouraged to follow up with [facility]. -refill of Xanax provided (decreased to 2 mg in AM, 1 mg at lunch, 1 mg at night). Continue to wean as this is likely not the best medical therapy for her Bipolar/OCD/Panic Disorder   Onychomycosis Start Terbinafine 250 mg daily for 3 months.

## 2017-03-10 ENCOUNTER — Encounter: Payer: Self-pay | Admitting: Family Medicine

## 2017-03-10 ENCOUNTER — Ambulatory Visit (INDEPENDENT_AMBULATORY_CARE_PROVIDER_SITE_OTHER): Payer: Medicare Other | Admitting: Family Medicine

## 2017-03-10 DIAGNOSIS — M7062 Trochanteric bursitis, left hip: Secondary | ICD-10-CM

## 2017-03-10 DIAGNOSIS — B351 Tinea unguium: Secondary | ICD-10-CM | POA: Insufficient documentation

## 2017-03-10 DIAGNOSIS — M25522 Pain in left elbow: Secondary | ICD-10-CM

## 2017-03-10 DIAGNOSIS — M51369 Other intervertebral disc degeneration, lumbar region without mention of lumbar back pain or lower extremity pain: Secondary | ICD-10-CM

## 2017-03-10 DIAGNOSIS — M5136 Other intervertebral disc degeneration, lumbar region: Secondary | ICD-10-CM | POA: Diagnosis not present

## 2017-03-10 DIAGNOSIS — L732 Hidradenitis suppurativa: Secondary | ICD-10-CM

## 2017-03-10 DIAGNOSIS — F3181 Bipolar II disorder: Secondary | ICD-10-CM | POA: Diagnosis not present

## 2017-03-10 MED ORDER — ALPRAZOLAM 1 MG PO TABS: ORAL_TABLET | ORAL | 0 refills | Status: DC

## 2017-03-10 MED ORDER — DOXYCYCLINE HYCLATE 100 MG PO TABS: 100.0000 mg | ORAL_TABLET | Freq: Two times a day (BID) | ORAL | 0 refills | Status: DC

## 2017-03-10 MED ORDER — TERBINAFINE HCL 250 MG PO TABS: 250.0000 mg | ORAL_TABLET | Freq: Every day | ORAL | 2 refills | Status: DC

## 2017-03-10 NOTE — Assessment & Plan Note (Signed)
New boil of right inner groin. -10 day course of Doxycycline

## 2017-03-10 NOTE — Patient Instructions (Signed)
It was nice to see you today.  Boil in Groin - start 10 day course of Doxycycline.   Toe Fungus - start Terbinafine, need to take for 3 months  Anxiety -  You need to make an appointment at the [facility]  Low back pain - continue Lyrica/Mobic/Flexeril. Start low back exercises.

## 2017-03-10 NOTE — Assessment & Plan Note (Signed)
Patient strongly encouraged to follow up with [facility]. -refill of Xanax provided (decreased to 2 mg in AM, 1 mg at lunch, 1 mg at night). Continue to wean as this is likely not the best medical therapy for her Bipolar/OCD/Panic Disorder

## 2017-03-10 NOTE — Assessment & Plan Note (Signed)
Start Terbinafine 250 mg daily for 3 months.

## 2017-03-10 NOTE — Assessment & Plan Note (Signed)
Exam consistent with muscle spasm. MRI in 09/2016 showed mild DDD.  -continue Lyrica, Mobic, and Flexeril -provided home exercises -encouraged heat to affected areas

## 2017-03-10 NOTE — Assessment & Plan Note (Signed)
Pain resolved s/p steroid injection at last visit.

## 2017-03-10 NOTE — Assessment & Plan Note (Signed)
Resolved from last visit.

## 2017-03-19 ENCOUNTER — Other Ambulatory Visit: Payer: Self-pay | Admitting: Family Medicine

## 2017-03-22 ENCOUNTER — Other Ambulatory Visit: Payer: Self-pay | Admitting: Family Medicine

## 2017-03-22 DIAGNOSIS — L732 Hidradenitis suppurativa: Secondary | ICD-10-CM

## 2017-03-23 ENCOUNTER — Other Ambulatory Visit: Payer: Self-pay | Admitting: Family Medicine

## 2017-03-23 DIAGNOSIS — L732 Hidradenitis suppurativa: Secondary | ICD-10-CM

## 2017-03-23 NOTE — Telephone Encounter (Signed)
Contacted pharmacy and told her the situation and she checked and said that she would delete this one out.  I asked if they had the refill that was sent in yesterday and she said yes. Katharina Caper, Sinclair Arrazola D, [place]

## 2017-03-23 NOTE — Telephone Encounter (Signed)
Routing to PCP as an FYI. Zimmerman Rumple, April D, CMA  

## 2017-03-23 NOTE — Telephone Encounter (Signed)
Please call the pharmacy and ask about this.  I refilled this medicine for her yesterday.  This is the 3rd refill request that has come through since I refilled it yesterday.

## 2017-03-24 NOTE — Telephone Encounter (Signed)
Thanks

## 2017-04-12 ENCOUNTER — Ambulatory Visit: Payer: Medicare Other | Admitting: Adult Health

## 2017-04-18 ENCOUNTER — Other Ambulatory Visit: Payer: Self-pay | Admitting: Family Medicine

## 2017-04-18 NOTE — Telephone Encounter (Signed)
2nd request, pt states she is out medication. ep

## 2017-04-18 NOTE — Telephone Encounter (Signed)
Patient calling to request refill of:  Name of Medication(s):  Xanax Last date of OV:  03/10/17 Ree Kida) [facility] [place]  Will route refill request to Clinic RN.  Discussed with patient policy to call pharmacy for future refills.  Also, discussed refills may take up to 48 hours to approve or deny.  [place]

## 2017-04-19 MED ORDER — XANAX 1 MG PO TABS: ORAL_TABLET | ORAL | 0 refills | Status: DC

## 2017-04-19 NOTE — Telephone Encounter (Signed)
Pt calling again about Xanax refill. Ottis Stain, CMA

## 2017-04-26 ENCOUNTER — Other Ambulatory Visit: Payer: Self-pay | Admitting: Family Medicine

## 2017-05-05 ENCOUNTER — Other Ambulatory Visit: Payer: Self-pay | Admitting: *Deleted

## 2017-05-05 DIAGNOSIS — L290 Pruritus ani: Secondary | ICD-10-CM

## 2017-05-05 MED ORDER — HYDROCORTISONE 2.5 % EX LOTN: TOPICAL_LOTION | Freq: Two times a day (BID) | CUTANEOUS | 1 refills | Status: AC

## 2017-05-10 ENCOUNTER — Other Ambulatory Visit: Payer: Self-pay | Admitting: *Deleted

## 2017-05-11 MED ORDER — PREGABALIN 75 MG PO CAPS: ORAL_CAPSULE | ORAL | 1 refills | Status: DC

## 2017-05-11 NOTE — Telephone Encounter (Signed)
2nd request.  Martin, Tamika L, RN  

## 2017-05-19 ENCOUNTER — Ambulatory Visit: Payer: Medicare Other | Admitting: Adult Health

## 2017-05-20 ENCOUNTER — Other Ambulatory Visit: Payer: Self-pay | Admitting: *Deleted

## 2017-05-20 ENCOUNTER — Other Ambulatory Visit: Payer: Self-pay | Admitting: Family Medicine

## 2017-05-20 MED ORDER — XANAX 1 MG PO TABS: ORAL_TABLET | ORAL | 0 refills | Status: DC

## 2017-05-20 NOTE — Telephone Encounter (Signed)
This was handled in separate encounter. Leeanne Rio, MD

## 2017-05-20 NOTE — Telephone Encounter (Signed)
Patient will be out of medication. Patient is requesting a call from a provider [phone]. Derl Barrow, RN

## 2017-05-20 NOTE — Telephone Encounter (Signed)
Pt is calling to check the status of her request for Xanax. She can not be without this all weekend. jw

## 2017-05-20 NOTE — Telephone Encounter (Signed)
Called patient & spoke with her. She confirms she takes xanax 4 pill per day. Advised she cannot call day of expecting a refill on a controlled substance. Has upcoming appointment with her psychiatrist on 9/4 and then to meet new PCP Dr. Mingo Amber on 9/7. Recommended she ask psychiatrist to take over prescribing xanax. I called in a 1 month supply for patient to her pharmacy. She was appreciative.  Leeanne Rio, MD

## 2017-05-20 NOTE — Telephone Encounter (Signed)
Patient calling again regarding refill on alprazolam, would like call from provider.

## 2017-05-31 DIAGNOSIS — F4001 Agoraphobia with panic disorder: Secondary | ICD-10-CM | POA: Diagnosis not present

## 2017-05-31 DIAGNOSIS — F410 Panic disorder [episodic paroxysmal anxiety] without agoraphobia: Secondary | ICD-10-CM | POA: Diagnosis not present

## 2017-05-31 DIAGNOSIS — F429 Obsessive-compulsive disorder, unspecified: Secondary | ICD-10-CM | POA: Diagnosis not present

## 2017-06-03 ENCOUNTER — Ambulatory Visit: Payer: Self-pay | Admitting: Family Medicine

## 2017-06-08 ENCOUNTER — Other Ambulatory Visit: Payer: Self-pay | Admitting: Family Medicine

## 2017-06-17 ENCOUNTER — Ambulatory Visit (INDEPENDENT_AMBULATORY_CARE_PROVIDER_SITE_OTHER): Payer: Medicare Other | Admitting: Family Medicine

## 2017-06-17 ENCOUNTER — Encounter: Payer: Self-pay | Admitting: Family Medicine

## 2017-06-17 VITALS — BP 122/78 | HR 86 | Temp 98.3°F | Ht 61.0 in | Wt 169.2 lb

## 2017-06-17 DIAGNOSIS — Z23 Encounter for immunization: Secondary | ICD-10-CM | POA: Diagnosis present

## 2017-06-17 DIAGNOSIS — F4000 Agoraphobia, unspecified: Secondary | ICD-10-CM | POA: Diagnosis not present

## 2017-06-17 DIAGNOSIS — E785 Hyperlipidemia, unspecified: Secondary | ICD-10-CM

## 2017-06-17 DIAGNOSIS — F3181 Bipolar II disorder: Secondary | ICD-10-CM | POA: Diagnosis not present

## 2017-06-17 DIAGNOSIS — F4001 Agoraphobia with panic disorder: Secondary | ICD-10-CM | POA: Diagnosis not present

## 2017-06-17 DIAGNOSIS — M5136 Other intervertebral disc degeneration, lumbar region: Secondary | ICD-10-CM | POA: Diagnosis not present

## 2017-06-17 MED ORDER — XANAX 1 MG PO TABS: ORAL_TABLET | ORAL | 0 refills | Status: DC

## 2017-06-17 MED ORDER — MELOXICAM 15 MG PO TABS: 15.0000 mg | ORAL_TABLET | Freq: Every day | ORAL | 2 refills | Status: AC

## 2017-06-17 NOTE — Assessment & Plan Note (Signed)
Rechecking lipid panel today

## 2017-06-17 NOTE — Assessment & Plan Note (Signed)
Referral to Psych today.

## 2017-06-17 NOTE — Patient Instructions (Signed)
It was good to meet you today!  Refills for medicines today.  Flu shot and bloodwork today as well.   I have refilled your Mobic for your back pain.  Make sure to call Dr. Benson Norway to get your colonoscopy.  Also make to get your mammogram.

## 2017-06-17 NOTE — Progress Notes (Signed)
Subjective:    Jenna Burke is a 56 y.o. female who presents to [facility] today for anxiety And to meet her new PCP:  1.  Anxiety:  Patient is on Xanax and Anafranil for this. She was previously seen by psychiatry but has not seen them in a few years. She's been referred to the [facility] but has not yet made this appointment.  Has anxiety and what sounds or phobia most days. Has not been seen by therapists and at least like past several years. Denies any depressive symptoms. No suicidal or homicidal ideation.  2. Back pain: Also chronic for years. No recent injuries. Better with Mobic and heating pads. No bladder or bowel incontinence. Some radiation to her legs better with Lyrica.  3. Preventative medicine patient is overdue for her flu shot, mammogram, colonoscopy. She has had a prior colonoscopy by Dr. Benson Norway with gastroenterology. Mother diagnosed with colon cancer.  Also due for flu shot.  #4. Hyperlipidemia: Overdue for lipid panel. She is on Lipitor. No myalgias.   ROS as above per HPI.    The following portions of the patient's history were reviewed and updated as appropriate: allergies, current medications, past medical history, family and social history, and problem list. Patient is a nonsmoker.    PMH reviewed.  Past Medical History:  Diagnosis Date  . Abnormal MRI, cervical spine 11/2007   DDD affecting root not cord  . Atypical chest pain 10/24/2013  . Esophageal reflux disease 10/1999   EGD showed esophagitis  . Fracture of metatarsal bone of left foot 08/2003   3&4  . Headache   . Seizures ([place])    Past Surgical History:  Procedure Laterality Date  . BIOPSY ENDOMETRIAL  07/1991  . BIOPSY ENDOMETRIAL  06/2004   secretory  . COLONOSCOPY  623/2006   normal  . PERINEAL HIDRADENITIS EXCISION  12/2002   incision and drainage  . WRIST SURGERY     Right    Medications reviewed. Current Outpatient Prescriptions  Medication Sig Dispense Refill  . ANAFRANIL 50 MG capsule  TAKE 2 CAPSULES BY MOUTH EVERY MORNING AND 3 CAPSULES AT BEDTIME 150 capsule 5  . aspirin EC 81 MG tablet Take 1 tablet (81 mg total) by mouth daily. 90 tablet 1  . atorvastatin (LIPITOR) 40 MG tablet TAKE 1 TABLET (40 MG TOTAL) BY MOUTH DAILY. 90 tablet 1  . cyclobenzaprine (FLEXERIL) 5 MG tablet TAKE 1 TABLET 3 TIMES A DAY AS NEEDED FOR MUSCLE SPASM 60 tablet 1  . cyclobenzaprine (FLEXERIL) 5 MG tablet TAKE 1 TABLET 3 TIMES A DAY AS NEEDED FOR MUSCLE SPASM 60 tablet 1  . doxycycline (VIBRA-TABS) 100 MG tablet TAKE 1 TABLET BY MOUTH TWICE A DAY 20 tablet 0  . doxycycline (VIBRA-TABS) 100 MG tablet TAKE 1 TABLET BY MOUTH TWICE A DAY 20 tablet 0  . fluticasone (FLONASE) 50 MCG/ACT nasal spray Place 2 sprays into both nostrils daily. 16 g 6  . hydrocortisone 2.5 % lotion Apply topically 2 (two) times daily. 59 mL 1  . meloxicam (MOBIC) 15 MG tablet TAKE 1 TABLET (15 MG TOTAL) BY MOUTH DAILY. FOR 10 DAYS, THEN DAILY AS NEEDED FOR PAIN 30 tablet 2  . meloxicam (MOBIC) 15 MG tablet TAKE 1 TABLET (15 MG TOTAL) BY MOUTH DAILY. FOR 10 DAYS, THEN DAILY AS NEEDED FOR PAIN 30 tablet 2  . omeprazole (PRILOSEC) 20 MG capsule Take 1 capsule (20 mg total) by mouth 2 (two) times daily. 180 capsule 1  .  pregabalin (LYRICA) 75 MG capsule TAKE ONE CAPSULE 3 TIMES A DAY 90 capsule 1  . promethazine (PHENERGAN) 25 MG tablet TAKE 1 TABLET (25 MG TOTAL) BY MOUTH EVERY 6 (SIX) HOURS AS NEEDED FOR NAUSEA OR VOMITING. 30 tablet 5  . propranolol ER (INDERAL LA) 80 MG 24 hr capsule Take 1 capsule (80 mg total) by mouth at bedtime. 90 capsule 4  . SUMAtriptan (IMITREX) 25 MG tablet TAKE 1 TABLET EVERY 2 HRS FOR MIGRAINE,MAY REPEAT IN 2 HRS IF HEADACHE PERSIST/RECURS 12 tablet 6  . terbinafine (LAMISIL) 250 MG tablet Take 1 tablet (250 mg total) by mouth daily. 30 tablet 2  . topiramate (TOPAMAX) 25 MG tablet Take 2 tablets (50 mg total) by mouth daily. 180 tablet 4  . XANAX 1 MG tablet TAKE 2 TABLETS IN THE MORNING,1 TABLET AT  LUNCH, 1 TABLETS AT NIGHT 120 tablet 0   No current facility-administered medications for this visit.      Objective:   Physical Exam BP 122/78   Pulse 86   Temp 98.3 F (36.8 C) (Oral)   Ht 5\' 1"  (1.549 m)   Wt 169 lb 3.2 oz (76.7 kg)   SpO2 96%   BMI 31.97 kg/m  Gen:  Alert, cooperative patient who appears stated age in no acute distress.  Vital signs reviewed. HEENT: EOMI,  MMM Cardiac:  Regular rate and rhythm without murmur auscultated.  Good S1/S2. Pulm:  Clear to auscultation bilaterally with good air movement.  No wheezes or rales noted.   MSK:  Nontender on today's exam.   Psych:  Flat affect.  Not depressed appearing.   *Poor health literacy.  Patient's sister answers most of her questions.

## 2017-06-17 NOTE — Assessment & Plan Note (Signed)
Back pain persists.  Some days are better than others.   Continue Lyrica. Continue Mobic Continue heating pad.

## 2017-06-17 NOTE — Assessment & Plan Note (Signed)
Refill for medications today. I wonder if  There might not be some better medicines for her.  Will refer to psychiatry today.

## 2017-06-18 LAB — TSH: TSH: 5.83 u[IU]/mL — ABNORMAL HIGH (ref 0.450–4.500)

## 2017-06-18 LAB — COMPREHENSIVE METABOLIC PANEL
ALBUMIN: 4.3 g/dL (ref 3.5–5.5)
ALK PHOS: 50 IU/L (ref 39–117)
ALT: 13 IU/L (ref 0–32)
AST: 17 IU/L (ref 0–40)
Albumin/Globulin Ratio: 1.9 (ref 1.2–2.2)
BUN / CREAT RATIO: 14 (ref 9–23)
BUN: 9 mg/dL (ref 6–24)
CHLORIDE: 97 mmol/L (ref 96–106)
CO2: 24 mmol/L (ref 20–29)
CREATININE: 0.64 mg/dL (ref 0.57–1.00)
Calcium: 9 mg/dL (ref 8.7–10.2)
GFR calc non Af Amer: 100 mL/min/{1.73_m2} (ref >59)
GFR, EST AFRICAN AMERICAN: 115 mL/min/{1.73_m2} (ref >59)
GLOBULIN, TOTAL: 2.3 g/dL (ref 1.5–4.5)
GLUCOSE: 74 mg/dL (ref 65–99)
Potassium: 3.1 mmol/L — ABNORMAL LOW (ref 3.5–5.2)
SODIUM: 135 mmol/L (ref 134–144)
TOTAL PROTEIN: 6.6 g/dL (ref 6.0–8.5)

## 2017-06-18 LAB — CBC
HEMATOCRIT: 34.5 % (ref 34.0–46.6)
Hemoglobin: 11.4 g/dL (ref 11.1–15.9)
MCH: 28 pg (ref 26.6–33.0)
MCHC: 33 g/dL (ref 31.5–35.7)
MCV: 85 fL (ref 79–97)
PLATELETS: 254 10*3/uL (ref 150–379)
RBC: 4.07 x10E6/uL (ref 3.77–5.28)
RDW: 14.6 % (ref 12.3–15.4)
WBC: 8.6 10*3/uL (ref 3.4–10.8)

## 2017-06-18 LAB — LIPID PANEL
CHOLESTEROL TOTAL: 187 mg/dL (ref 100–199)
Chol/HDL Ratio: 3.6 ratio (ref 0.0–4.4)
HDL: 52 mg/dL (ref >39)
LDL Calculated: 114 mg/dL — ABNORMAL HIGH (ref 0–99)
Triglycerides: 106 mg/dL (ref 0–149)
VLDL CHOLESTEROL CAL: 21 mg/dL (ref 5–40)

## 2017-06-23 ENCOUNTER — Other Ambulatory Visit: Payer: Self-pay | Admitting: Family Medicine

## 2017-06-23 ENCOUNTER — Encounter: Payer: Self-pay | Admitting: Family Medicine

## 2017-06-23 DIAGNOSIS — B351 Tinea unguium: Secondary | ICD-10-CM

## 2017-06-27 ENCOUNTER — Other Ambulatory Visit: Payer: Self-pay | Admitting: Family Medicine

## 2017-06-27 DIAGNOSIS — F429 Obsessive-compulsive disorder, unspecified: Secondary | ICD-10-CM | POA: Diagnosis not present

## 2017-06-27 DIAGNOSIS — F410 Panic disorder [episodic paroxysmal anxiety] without agoraphobia: Secondary | ICD-10-CM | POA: Diagnosis not present

## 2017-06-27 DIAGNOSIS — F4001 Agoraphobia with panic disorder: Secondary | ICD-10-CM | POA: Diagnosis not present

## 2017-06-28 ENCOUNTER — Other Ambulatory Visit: Payer: Self-pay | Admitting: *Deleted

## 2017-06-28 DIAGNOSIS — B351 Tinea unguium: Secondary | ICD-10-CM

## 2017-06-29 MED ORDER — TERBINAFINE HCL 250 MG PO TABS: 250.0000 mg | ORAL_TABLET | Freq: Every day | ORAL | 1 refills | Status: DC

## 2017-06-30 ENCOUNTER — Other Ambulatory Visit: Payer: Self-pay | Admitting: Family Medicine

## 2017-07-12 ENCOUNTER — Other Ambulatory Visit: Payer: Self-pay | Admitting: Family Medicine

## 2017-07-12 DIAGNOSIS — L732 Hidradenitis suppurativa: Secondary | ICD-10-CM

## 2017-07-13 ENCOUNTER — Other Ambulatory Visit: Payer: Self-pay | Admitting: Family Medicine

## 2017-07-13 NOTE — Telephone Encounter (Signed)
Pt is calling for a refill on her Xanax to be called in. She said that this is do on 07/17/17 and this falls on Sunday. So can we get this called in so that she can pick this up on Sunday. jw

## 2017-07-13 NOTE — Telephone Encounter (Signed)
Pt is calling because her pharmacy has said that they keep faxing [place] about refills on several of her medication and we are not responding. Can someone check into this. jw

## 2017-07-13 NOTE — Telephone Encounter (Signed)
I am covering for Dr. Mingo Amber who is away from the office.  I called pharmacy to confirm these are the 2 medications she needs (doxycycline and cyclobenzaprine). I will refill cyclobenzaprine. Doxycycline is an antibiotic and not a long-term medication for her (last prescribed 4 months ago) so I will not refill without an office visit.  Please let patient know.  Thanks Leeanne Rio, MD

## 2017-07-13 NOTE — Telephone Encounter (Signed)
Called and spoke to pt. She is also requesting a refill on her xanax. It refills on the 21st but that is a Sunday. She would like to pick up the Rx Friday. Please call pt and let her know if this is possible. Ottis Stain, CMA

## 2017-07-14 MED ORDER — XANAX 1 MG PO TABS: ORAL_TABLET | ORAL | 0 refills | Status: DC

## 2017-07-14 NOTE — Telephone Encounter (Signed)
I am covering for Dr. Mingo Amber who is away from the office.  [place] Controlled Substance Database reviewed, findings are appropriate - last fill on 06/17/17.  Will refill for 1 month until patient can follow up with PCP.  Please advise patient she can pick up the rx. I will place it at the front desk.  Thanks Leeanne Rio, MD

## 2017-07-14 NOTE — Telephone Encounter (Signed)
Pt is calling and just wanted to touch base with the doctor about her prescription of Xanax. She will be out on Sunday and would like to have this sent in or come and pick up Friday, jw

## 2017-07-14 NOTE — Telephone Encounter (Signed)
LVM for pt to call the office. If she calls, please let her know Rx is up front for pick up. Ottis Stain, CMA

## 2017-07-15 ENCOUNTER — Other Ambulatory Visit: Payer: Self-pay | Admitting: Family Medicine

## 2017-07-29 ENCOUNTER — Other Ambulatory Visit: Payer: Self-pay | Admitting: Family Medicine

## 2017-08-07 ENCOUNTER — Other Ambulatory Visit: Payer: Self-pay | Admitting: Family Medicine

## 2017-08-09 ENCOUNTER — Telehealth: Payer: Self-pay | Admitting: Family Medicine

## 2017-08-09 ENCOUNTER — Telehealth: Payer: Self-pay | Admitting: Neurology

## 2017-08-09 ENCOUNTER — Other Ambulatory Visit: Payer: Self-pay | Admitting: Family Medicine

## 2017-08-09 MED ORDER — PREGABALIN 75 MG PO CAPS: 75.0000 mg | ORAL_CAPSULE | Freq: Three times a day (TID) | ORAL | 3 refills | Status: AC

## 2017-08-09 NOTE — Telephone Encounter (Signed)
Pt states she has been having head aches and also having great difficulty falling asleep.  Pt is asking if something can be called in to help her sleep.  If something is called in please use   [id number] [place] [place] [phone] (Phone) [phone] (Fax)

## 2017-08-09 NOTE — Telephone Encounter (Signed)
[facility] [address] I called pharmacy to confirm they received Rx. [facility], Rx was filled earlier today and is ready for pick up. Called Pt, LVM letting pt know Rx is ready. Ottis Stain, CMA

## 2017-08-09 NOTE — Telephone Encounter (Signed)
Returned call to pt. Informed pt that I have sent a message to Dr. Mingo Amber about the Xanax. I told her that I would let her know when the Rx has been sent. She is very concerned because she will run out this weekend. Please advise. Ottis Stain, CMA

## 2017-08-09 NOTE — Telephone Encounter (Signed)
Spoke to patient - she is going to try her home rescue medications for migraine management (sumatriptan, promethazine).  Her sleep difficulty has never been evaluated by Dr. Krista Blue previously.  She has a pending follow up on 09/01/17 and will discuss with MD at her next appt.

## 2017-08-09 NOTE — Addendum Note (Signed)
Addended byMingo Amber, Kayleen Memos on: 08/09/2017 02:49 PM   Modules accepted: Orders

## 2017-08-09 NOTE — Telephone Encounter (Signed)
Pt will run out of her Xanax this weekend. She would like a refill.

## 2017-08-09 NOTE — Telephone Encounter (Signed)
Pt was returning call she got from nurse and I read the message off to the pt. Pt was also calling concerning her Xanax since she will run out this coming weekend. She's also wanting that refilled.

## 2017-08-10 MED ORDER — XANAX 1 MG PO TABS: ORAL_TABLET | ORAL | 1 refills | Status: DC

## 2017-08-10 NOTE — Telephone Encounter (Signed)
I have printed this and placed it in the to be faxed box.

## 2017-08-31 ENCOUNTER — Other Ambulatory Visit: Payer: Self-pay | Admitting: Neurology

## 2017-08-31 ENCOUNTER — Telehealth: Payer: Self-pay | Admitting: Family Medicine

## 2017-08-31 MED ORDER — CLOMIPRAMINE HCL 50 MG PO CAPS: ORAL_CAPSULE | ORAL | 5 refills | Status: DC

## 2017-08-31 NOTE — Telephone Encounter (Signed)
I have sent this in

## 2017-08-31 NOTE — Telephone Encounter (Signed)
Patient called prescription for Afronil(?) to [facility] but they said it needs to be sent to them by PCP.

## 2017-09-01 ENCOUNTER — Encounter: Payer: Self-pay | Admitting: Neurology

## 2017-09-01 ENCOUNTER — Ambulatory Visit (INDEPENDENT_AMBULATORY_CARE_PROVIDER_SITE_OTHER): Payer: Medicare Other | Admitting: Neurology

## 2017-09-01 VITALS — BP 147/92 | HR 84 | Ht 61.0 in | Wt 173.0 lb

## 2017-09-01 DIAGNOSIS — G43709 Chronic migraine without aura, not intractable, without status migrainosus: Secondary | ICD-10-CM | POA: Diagnosis not present

## 2017-09-01 DIAGNOSIS — IMO0002 Reserved for concepts with insufficient information to code with codable children: Secondary | ICD-10-CM

## 2017-09-01 MED ORDER — RIZATRIPTAN BENZOATE 10 MG PO TBDP: 10.0000 mg | ORAL_TABLET | ORAL | 6 refills | Status: DC | PRN
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
## 2017-09-07 ENCOUNTER — Other Ambulatory Visit: Payer: Self-pay | Admitting: Family Medicine

## 2017-09-07 DIAGNOSIS — L732 Hidradenitis suppurativa: Secondary | ICD-10-CM

## 2017-09-07 MED ORDER — CYCLOBENZAPRINE HCL 5 MG PO TABS: ORAL_TABLET | ORAL | 3 refills | Status: DC

## 2017-09-07 MED ORDER — CLOMIPRAMINE HCL 50 MG PO CAPS: ORAL_CAPSULE | ORAL | 5 refills | Status: AC

## 2017-09-07 NOTE — Telephone Encounter (Signed)
Patient left message on nurse line requesting refills on anafranil and cyclobenzaprine. Hubbard Hartshorn, RN, BSN

## 2017-09-07 NOTE — Telephone Encounter (Signed)
Needs refill on doxycycline.  [facility]

## 2017-09-09 ENCOUNTER — Ambulatory Visit: Payer: Self-pay | Admitting: Family Medicine

## 2017-09-12 ENCOUNTER — Telehealth: Payer: Self-pay | Admitting: Neurology

## 2017-09-12 ENCOUNTER — Ambulatory Visit: Payer: Self-pay | Admitting: Family Medicine

## 2017-09-12 ENCOUNTER — Other Ambulatory Visit: Payer: Self-pay | Admitting: Neurology

## 2017-09-12 MED ORDER — RIZATRIPTAN BENZOATE 10 MG PO TBDP: ORAL_TABLET | ORAL | 6 refills | Status: DC

## 2017-09-12 NOTE — Telephone Encounter (Signed)
Pt was inquiring about the name of her new medication.  She is aware a prescription for rizatriptan was sent to the pharmacy for her.

## 2017-09-12 NOTE — Addendum Note (Signed)
Addended by: Noberto Retort C on: 09/12/2017 01:57 PM   Modules accepted: Orders

## 2017-09-12 NOTE — Telephone Encounter (Signed)
Pt states that Dr Krista Blue had called in a new prescription for a new medication but pt states she was given the same prescription as before and not the new one.  Pt is asking the new prescription be sent [facility] to  [id number] [place] [place] - [place] [phone] (Phone) [phone] (Fax)   Pt asking for a call from [place]

## 2017-09-13 ENCOUNTER — Telehealth: Payer: Self-pay | Admitting: Neurology

## 2017-09-13 NOTE — Telephone Encounter (Signed)
I called to schedule patient for her botox injections, she did not answer so I left a VM asking her to call me back.

## 2017-09-13 NOTE — Telephone Encounter (Signed)
Patient called and scheduled injection.

## 2017-09-27 ENCOUNTER — Other Ambulatory Visit: Payer: Self-pay | Admitting: Neurology

## 2017-10-07 ENCOUNTER — Other Ambulatory Visit: Payer: Self-pay

## 2017-10-07 ENCOUNTER — Ambulatory Visit (INDEPENDENT_AMBULATORY_CARE_PROVIDER_SITE_OTHER): Payer: Medicare Other | Admitting: Family Medicine

## 2017-10-07 ENCOUNTER — Encounter: Payer: Self-pay | Admitting: Family Medicine

## 2017-10-07 VITALS — BP 124/82 | HR 75 | Temp 97.8°F | Ht 61.0 in | Wt 174.6 lb

## 2017-10-07 DIAGNOSIS — F4001 Agoraphobia with panic disorder: Secondary | ICD-10-CM

## 2017-10-07 DIAGNOSIS — E876 Hypokalemia: Secondary | ICD-10-CM | POA: Diagnosis not present

## 2017-10-07 DIAGNOSIS — E039 Hypothyroidism, unspecified: Secondary | ICD-10-CM | POA: Diagnosis not present

## 2017-10-07 DIAGNOSIS — R109 Unspecified abdominal pain: Secondary | ICD-10-CM | POA: Diagnosis present

## 2017-10-07 MED ORDER — CYCLOBENZAPRINE HCL 5 MG PO TABS: ORAL_TABLET | ORAL | 3 refills | Status: AC

## 2017-10-07 MED ORDER — XANAX 1 MG PO TABS: ORAL_TABLET | ORAL | 3 refills | Status: AC

## 2017-10-07 NOTE — Patient Instructions (Signed)
It was good to see you again today.  I have provided you with refills.  We are checking some labs today.    I do think you have a pulled muscle in your stomach.  Like I said this will continue to get better over time.  If it starts getting worse do not hesitate and come back.

## 2017-10-07 NOTE — Progress Notes (Signed)
Subjective:    Jenna Burke is a 57 y.o. female who presents to [facility] today for abdominal pain:  1.  Abdominal pain: Patient has had sharp stabbing abdominal pain in her lower abdomen "below my fat" since attempting to break up a dog fight about a week and half ago.  She was holding one dog and her sister was holding another when they began to attack each other.  The patient was pulled off of her feet.  She felt well the rest of the day and began to have pain the next day.  She describes this as a mild stabbing pain and just wants to make sure there is nothing else going on.  She is ambulating well.  Eating and drinking well.  No diarrhea.  No nausea or vomiting.  Pain is only present when she goes from a laying to a sitting position or sitting to standing.  2.  Anxiety: Patient is also here for refill of her anxiety medications.  She is on Xanax 3 times a day.  This controls her symptoms.  She has long-standing history of mental illness.  No suicidal homicidal ideation.  ROS as above per HPI.    The following portions of the patient's history were reviewed and updated as appropriate: allergies, current medications, past medical history, family and social history, and problem list. Patient is a nonsmoker.    PMH reviewed.  Past Medical History:  Diagnosis Date  . Abnormal MRI, cervical spine 11/2007   DDD affecting root not cord  . Atypical chest pain 10/24/2013  . Esophageal reflux disease 10/1999   EGD showed esophagitis  . Fracture of metatarsal bone of left foot 08/2003   3&4  . Headache   . Seizures ([place])    Past Surgical History:  Procedure Laterality Date  . BIOPSY ENDOMETRIAL  07/1991  . BIOPSY ENDOMETRIAL  06/2004   secretory  . COLONOSCOPY  623/2006   normal  . PERINEAL HIDRADENITIS EXCISION  12/2002   incision and drainage  . WRIST SURGERY     Right    Medications reviewed. Current Outpatient Medications  Medication Sig Dispense Refill  . Acetaminophen (TYLENOL PO)  Take 1,000 mg by mouth 3 (three) times daily.    Marland Kitchen aspirin EC 81 MG tablet Take 1 tablet (81 mg total) by mouth daily. 90 tablet 1  . atorvastatin (LIPITOR) 40 MG tablet TAKE 1 TABLET (40 MG TOTAL) BY MOUTH DAILY. 90 tablet 1  . clomiPRAMINE (ANAFRANIL) 50 MG capsule TAKE 2 CAPSULES BY MOUTH EVERY MORNING AND 3 CAPSULES AT BEDTIME 150 capsule 5  . cyclobenzaprine (FLEXERIL) 5 MG tablet TAKE 1 TABLET 3 TIMES A DAY AS NEEDED FOR MUSCLE SPASM 60 tablet 3  . doxycycline (VIBRA-TABS) 100 MG tablet TAKE 1 TABLET BY MOUTH TWICE A DAY 20 tablet 0  . fluticasone (FLONASE) 50 MCG/ACT nasal spray Place 2 sprays into both nostrils daily. 16 g 6  . hydrocortisone 2.5 % lotion Apply topically 2 (two) times daily. 59 mL 1  . meloxicam (MOBIC) 15 MG tablet Take 1 tablet (15 mg total) by mouth daily. For 10 days, then daily as needed for pain 30 tablet 2  . omeprazole (PRILOSEC) 20 MG capsule Take 1 capsule (20 mg total) by mouth 2 (two) times daily. 180 capsule 1  . pregabalin (LYRICA) 75 MG capsule Take 1 capsule (75 mg total) 3 (three) times daily by mouth. 90 capsule 3  . promethazine (PHENERGAN) 25 MG tablet TAKE 1 TABLET (25  MG TOTAL) BY MOUTH EVERY 6 (SIX) HOURS AS NEEDED FOR NAUSEA OR VOMITING. 30 tablet 5  . propranolol ER (INDERAL LA) 80 MG 24 hr capsule Take 1 capsule (80 mg total) by mouth at bedtime. 90 capsule 4  . rizatriptan (MAXALT-MLT) 10 MG disintegrating tablet Take 1 tab at onset of migraine.  May repeat in 2 hrs, if needed.  Max dose: 2 tabs/day. 30 day prescription. 15 tablet 6  . topiramate (TOPAMAX) 25 MG tablet Take 2 tablets (50 mg total) by mouth daily. 180 tablet 4  . XANAX 1 MG tablet TAKE 2 TABLETS IN THE MORNING,1 TABLET AT LUNCH, 1 TABLETS AT NIGHT 120 tablet 1   No current facility-administered medications for this visit.      Objective:   Physical Exam BP 124/82   Pulse 75   Temp 97.8 F (36.6 C) (Oral)   Ht 5\' 1"  (1.549 m)   Wt 174 lb 9.6 oz (79.2 kg)   SpO2 99%   BMI  32.99 kg/m  Gen:  Alert, cooperative patient who appears stated age in no acute distress.  Vital signs reviewed. HEENT: EOMI,  MMM Abd:  Soft/nondistended.  Tender to palpation suprapubically and left lower quadrant with minimal palpation.  However she does not have tenderness with deeper palpation.  No masses noted.  No hernia appreciated in the inguinal region.  Good bowel sounds throughout.  Rest of abdomen is completely benign Skin: No bruising of abdomen. Psych: She is much more well dressed and less disheveled today.  Much more conversant than prior visits.  No manic symptoms.  Not depressed or anxious appearing currently.  She has had some low potassium in the past we are checking this today.

## 2017-10-07 NOTE — Assessment & Plan Note (Signed)
She follows a [facility].  Currently well controlled with Xanax.  She should bring her other psychiatric medicines to her next visit.

## 2017-10-07 NOTE — Assessment & Plan Note (Signed)
Is likely secondary to pulled muscle.  Especially as it is worsened by muscular movements such as sitting or standing.  She states it is gradually improving.  She has taken some Tylenol with some relief.  She does not need any further medications for this.  Warning precautions provided.  See after visit instructions.

## 2017-10-08 LAB — CBC
HEMOGLOBIN: 12.6 g/dL (ref 11.1–15.9)
Hematocrit: 37.9 % (ref 34.0–46.6)
MCH: 29 pg (ref 26.6–33.0)
MCHC: 33.2 g/dL (ref 31.5–35.7)
MCV: 87 fL (ref 79–97)
Platelets: 331 10*3/uL (ref 150–379)
RBC: 4.35 x10E6/uL (ref 3.77–5.28)
RDW: 14.3 % (ref 12.3–15.4)
WBC: 11.5 10*3/uL — ABNORMAL HIGH (ref 3.4–10.8)

## 2017-10-08 LAB — TSH: TSH: 2.53 u[IU]/mL (ref 0.450–4.500)

## 2017-10-08 LAB — BASIC METABOLIC PANEL
BUN/Creatinine Ratio: 8 — ABNORMAL LOW (ref 9–23)
BUN: 5 mg/dL — ABNORMAL LOW (ref 6–24)
CALCIUM: 9.1 mg/dL (ref 8.7–10.2)
CHLORIDE: 101 mmol/L (ref 96–106)
CO2: 20 mmol/L (ref 20–29)
CREATININE: 0.64 mg/dL (ref 0.57–1.00)
GFR calc non Af Amer: 100 mL/min/{1.73_m2} (ref >59)
GFR, EST AFRICAN AMERICAN: 115 mL/min/{1.73_m2} (ref >59)
Glucose: 80 mg/dL (ref 65–99)
Potassium: 4.2 mmol/L (ref 3.5–5.2)
Sodium: 137 mmol/L (ref 134–144)

## 2017-10-26 ENCOUNTER — Telehealth: Payer: Self-pay

## 2017-10-26 NOTE — Telephone Encounter (Signed)
Patient left message on nurse line stating she has not received a letter or phone call regarding her lab results from 10/07/17 and would like to know results, particularly of TSH. Danley Danker, RN [facility] RN)

## 2017-10-27 ENCOUNTER — Ambulatory Visit: Payer: Medicare Other | Admitting: Neurology

## 2017-10-28 NOTE — Telephone Encounter (Signed)
Called to discuss labs with patient.  Minimally elevated WBC, otherwise all normal.  Patient not at home.  Sister answered.  Said she's doing well.  Reassuring.  She will have patient call to discuss the labs.

## 2017-10-31 ENCOUNTER — Other Ambulatory Visit: Payer: Self-pay | Admitting: Neurology

## 2017-11-07 ENCOUNTER — Other Ambulatory Visit: Payer: Self-pay | Admitting: Neurology

## 2017-11-09 ENCOUNTER — Other Ambulatory Visit: Payer: Self-pay | Admitting: *Deleted

## 2017-11-09 MED ORDER — RIZATRIPTAN BENZOATE 10 MG PO TABS: ORAL_TABLET | ORAL | 6 refills | Status: DC

## 2017-11-28 ENCOUNTER — Other Ambulatory Visit: Payer: Self-pay | Admitting: Family Medicine

## 2017-11-28 DIAGNOSIS — L732 Hidradenitis suppurativa: Secondary | ICD-10-CM

## 2017-11-28 NOTE — Telephone Encounter (Signed)
Pt has called 3 times today regarding refilling this medication. She states she has a new cyst that has started and is draining.  Wallace Cullens, RN

## 2017-11-28 NOTE — Telephone Encounter (Signed)
Called pt to let her know this was done and [facility]. Wallace Cullens, RN

## 2017-12-19 ENCOUNTER — Telehealth: Payer: Self-pay | Admitting: Neurology

## 2017-12-19 NOTE — Telephone Encounter (Signed)
I scheduled the patient for her botox injections. She would like to speak with the nurse regarding the topmax medication she is currently taking. It is not helping her. Please call and advise. [phone].

## 2017-12-19 NOTE — Telephone Encounter (Signed)
Left another message requesting a return call.

## 2017-12-19 NOTE — Telephone Encounter (Addendum)
Left message requesting a return call.

## 2017-12-20 ENCOUNTER — Other Ambulatory Visit: Payer: Self-pay | Admitting: *Deleted

## 2017-12-20 MED ORDER — RIZATRIPTAN BENZOATE 10 MG PO TABS: ORAL_TABLET | ORAL | 11 refills | Status: AC

## 2017-12-20 NOTE — Telephone Encounter (Signed)
She has been having daily, pressure headaches, along with migraines.  We have reviewed her daily medications (Topamax, Propranolol) to make sure she is taking them correctly.  She has not ever filled rizatriptan at the pharmacy.  She will pick it up today and try it for her migraines.  She has a pending appointment on 12/28/17 to receive Botox.

## 2017-12-28 ENCOUNTER — Telehealth: Payer: Self-pay | Admitting: Neurology

## 2017-12-28 ENCOUNTER — Encounter: Payer: Self-pay | Admitting: Neurology

## 2017-12-28 ENCOUNTER — Ambulatory Visit (INDEPENDENT_AMBULATORY_CARE_PROVIDER_SITE_OTHER): Payer: Medicare Other | Admitting: Neurology

## 2017-12-28 VITALS — BP 132/84 | HR 87 | Ht 61.0 in | Wt 167.5 lb

## 2017-12-28 DIAGNOSIS — IMO0002 Reserved for concepts with insufficient information to code with codable children: Secondary | ICD-10-CM

## 2017-12-28 DIAGNOSIS — G43709 Chronic migraine without aura, not intractable, without status migrainosus: Secondary | ICD-10-CM

## 2017-12-28 MED ORDER — ONABOTULINUMTOXINA 100 UNITS IJ SOLR
200.0000 [IU] | Freq: Once | INTRAMUSCULAR | Status: AC
  Administered 2017-12-28: 200 [IU] via INTRAMUSCULAR
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
## 2017-12-28 NOTE — Progress Notes (Signed)
**  Botox 100 units x 2 vials, NDC 6922-3009-79, Lot [id number], Exp 05/2020, office supply.//mck,rn**

## 2017-12-28 NOTE — Telephone Encounter (Signed)
3 month botox °

## 2017-12-29 NOTE — Telephone Encounter (Signed)
I called and scheduled the patient.  °

## 2018-01-04 ENCOUNTER — Other Ambulatory Visit: Payer: Self-pay | Admitting: Family Medicine

## 2018-01-25 DIAGNOSIS — 419620001 Death: Secondary | SNOMED CT | POA: Diagnosis not present

## 2018-01-25 DEATH — deceased

## 2018-03-25 ENCOUNTER — Other Ambulatory Visit: Payer: Self-pay | Admitting: Neurology

## 2018-03-31 ENCOUNTER — Other Ambulatory Visit: Payer: Self-pay

## 2018-03-31 DIAGNOSIS — E785 Hyperlipidemia, unspecified: Secondary | ICD-10-CM

## 2018-04-03 MED ORDER — ATORVASTATIN CALCIUM 40 MG PO TABS: 40.0000 mg | ORAL_TABLET | Freq: Every day | ORAL | 1 refills | Status: AC

## 2018-04-05 ENCOUNTER — Encounter: Payer: Self-pay | Admitting: Neurology

## 2018-04-05 ENCOUNTER — Telehealth: Payer: Self-pay | Admitting: *Deleted

## 2018-04-05 ENCOUNTER — Ambulatory Visit: Payer: Medicare Other | Admitting: Neurology

## 2018-04-05 NOTE — Telephone Encounter (Signed)
No showed her Botox appointment.

## 2018-04-24 IMAGING — MR MR HEAD WO/W CM
11 of 15 series · 25 of 48 positions shown · IV contrast (multihance)
Comparison: CT head 06/19/2015

CLINICAL DATA: Fall.  Headache.  Hyper reflexia.

EXAM:
MRI HEAD WITHOUT AND WITH CONTRAST
TECHNIQUE: Multiplanar, multiecho pulse sequences of the brain and surrounding
structures were obtained without and with intravenous contrast.
CONTRAST:  15mL MULTIHANCE GADOBENATE DIMEGLUMINE 529 MG/ML IV SOLN

[Series 2: FLAIR · sagittal · 5.0mm · 0.47mm/px · 1 of 23 slices shown (1 of 3)]
[im 1/23]
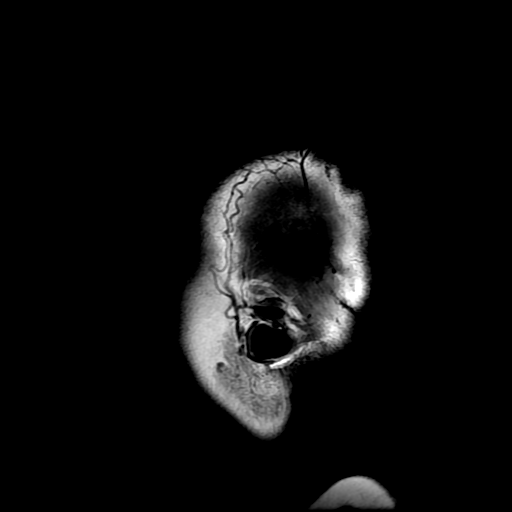

[Series 4: DWI · axial · 3.0mm · 0.94mm/px · z∈[-2,+143]mm · 3 of 100 slices shown (1 of 2)]
[im 1/100]
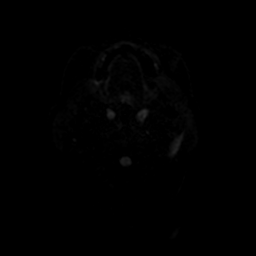
[im 50/100]
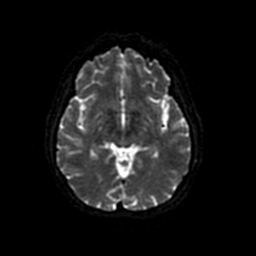
[im 100/100]
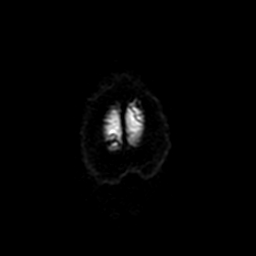

[Series 5: DWI · coronal · 4.0mm · 0.94mm/px · 2 of 60 slices shown (2 of 2)]
[im 1/60]
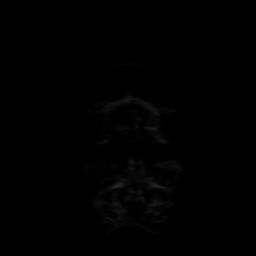
[im 60/60]
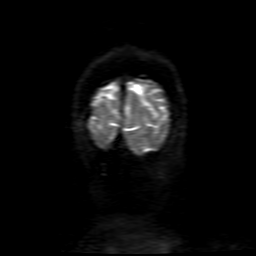

[Series 6: T2 · axial · 5.0mm · 0.47mm/px · 1 of 25 slices shown (1 of 2)]
[im 1/25]
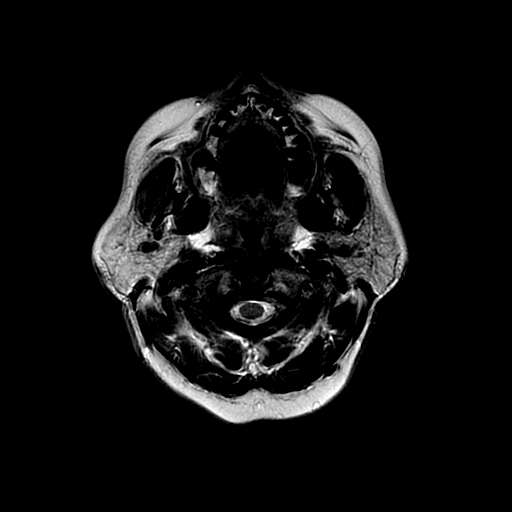

[Series 7: FLAIR · axial · 5.0mm · 0.47mm/px · 1 of 25 slices shown (2 of 3)]
[im 1/25]
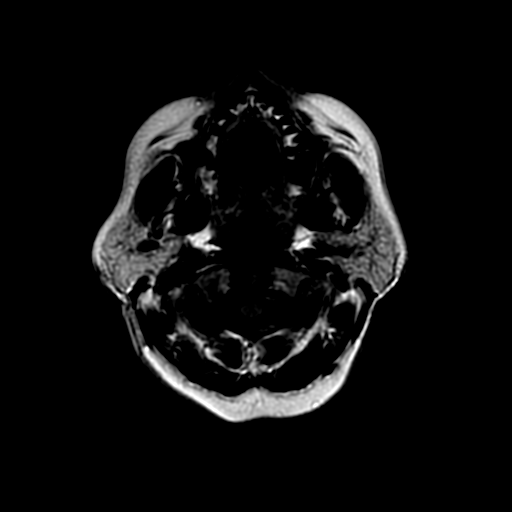

[Series 8: (person_name) · axial · 3.0mm · 0.47mm/px · z∈[-2,+96]mm · 3 of 100 slices shown]
[im 1/100]
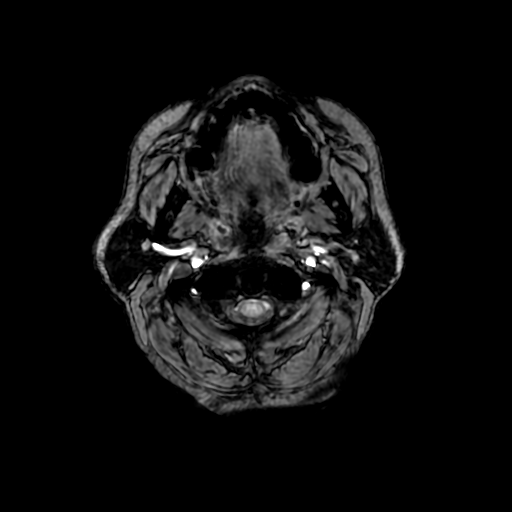
[im 34/100]
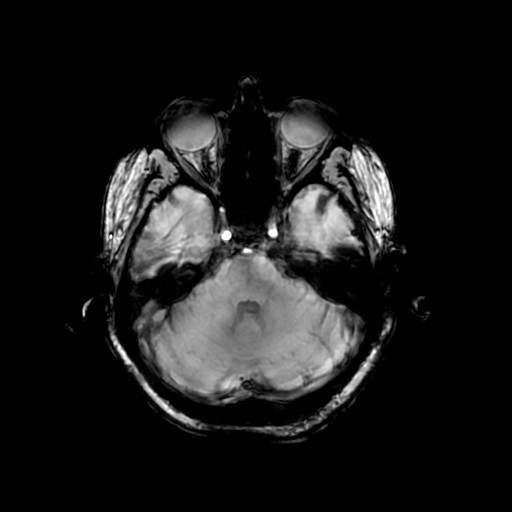
[im 67/100]
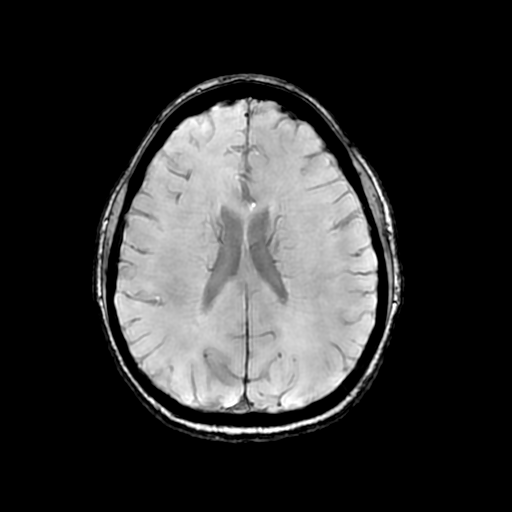

[Series 10: FLAIR · sagittal · 1.6mm · 0.49mm/px · 9 of 232 slices shown (3 of 3)]
[im 1/232]
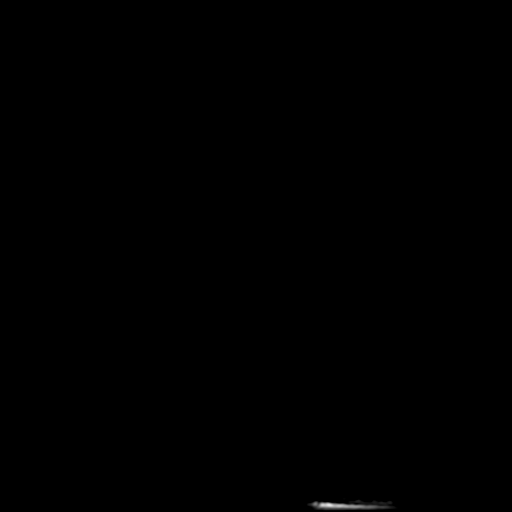
[im 29/232]
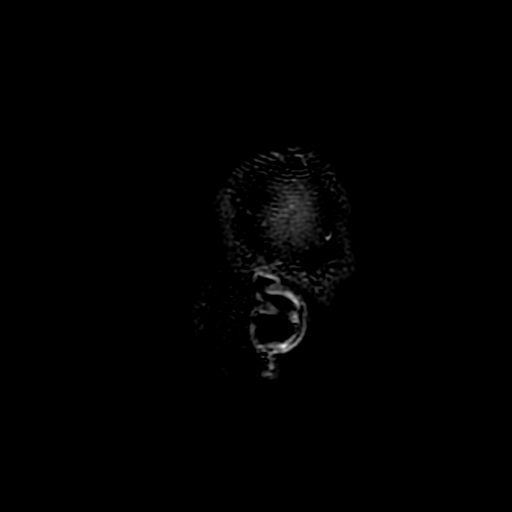
[im 58/232]
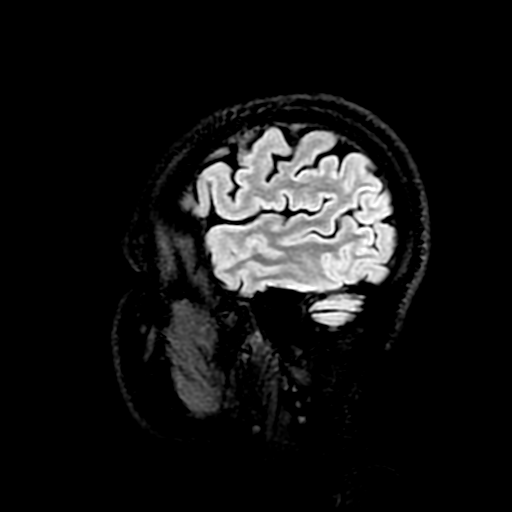
[im 87/232]
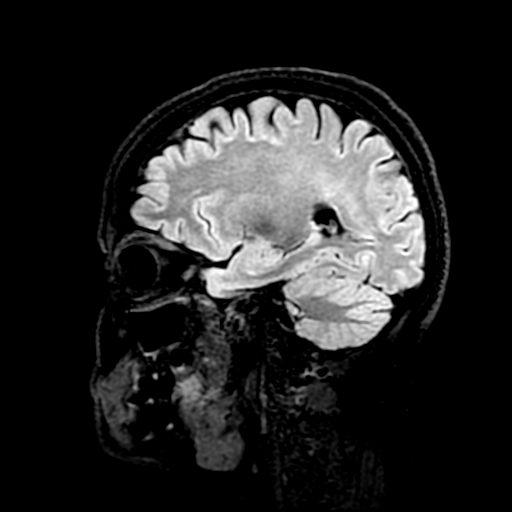
[im 116/232]
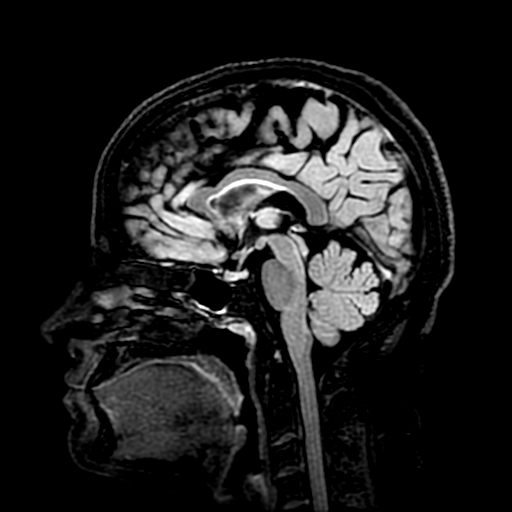
[im 145/232]
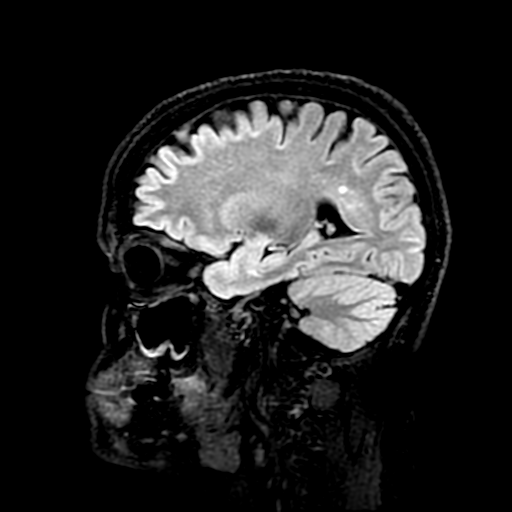
[im 174/232]
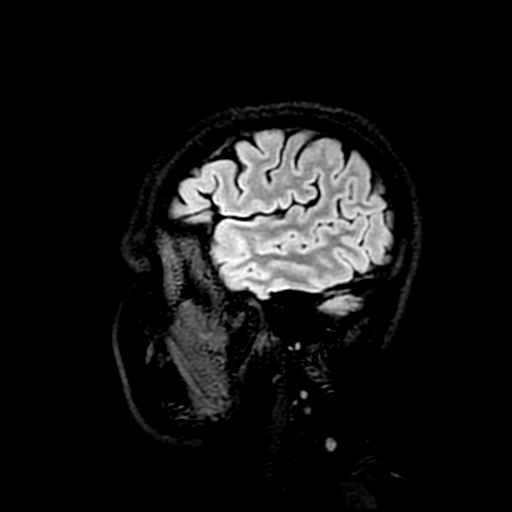
[im 203/232]
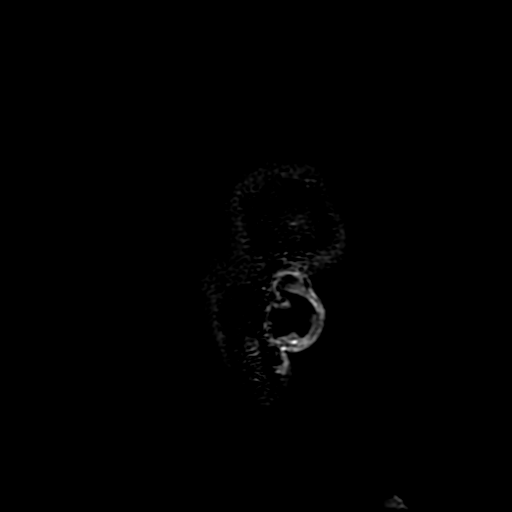
[im 232/232]
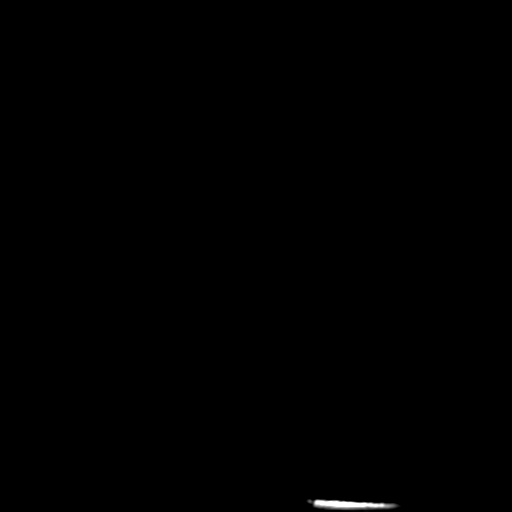

[Series 12: T2 · coronal · 5.0mm · 0.39mm/px · 1 of 26 slices shown (2 of 2)]
[im 1/26]
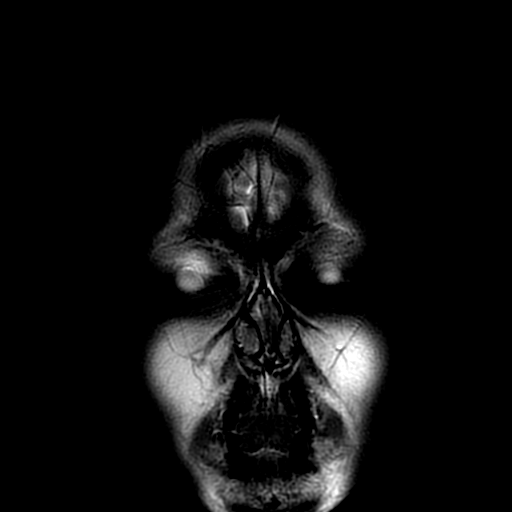

[Series 14: T1 · coronal · 5.0mm · 0.39mm/px · 1 of 26 slices shown]
[im 1/26]
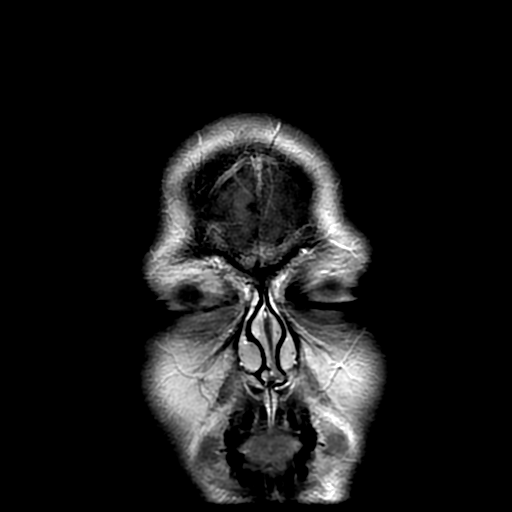

[Series 450: ADC · axial · 3.0mm · 0.94mm/px · z∈[-2,+143]mm · 2 of 48 slices shown (1 of 2)]
[im 1/48]
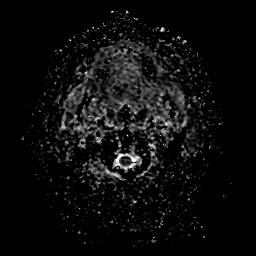
[im 48/48]
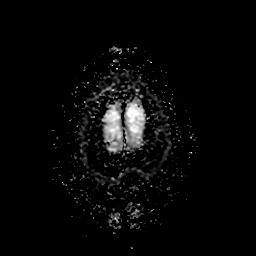

[Series 550: ADC · coronal · 4.0mm · 0.94mm/px · 1 of 30 slices shown (2 of 2)]
[im 1/30]
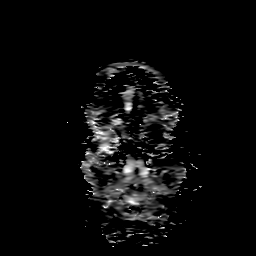

[25 of 48 positions shown; findings below may reference images not displayed]

FINDINGS: Ventricle size normal.  Cerebral volume normal.

Negative for acute infarct.

Scattered small white matter hyperintensities in cerebral white
matter bilaterally. Some of these are periventricular and some are
in the deep white matter. These are nonspecific.

Brainstem and basal ganglia normal.  Cerebellum normal.

Negative for hemorrhage or fluid collection.

Negative for mass or edema.  No shift of the midline structures.

Postcontrast imaging demonstrates normal enhancement. No enhancing
mass lesion. Normal venous enhancement.

Mild mucosal edema paranasal sinuses. Normal orbital contents.
Pituitary not enlarged.
IMPRESSION: No acute abnormality.

Scattered small white matter hyperintensities are nonspecific.
Differential includes chronic microvascular ischemia, migraine
headache, and demyelinating disease.

## 2018-07-12 ENCOUNTER — Ambulatory Visit: Payer: Medicaid Other | Admitting: Neurology
